# Patient Record
Sex: Female | Born: 1945 | Race: White | Marital: Married | Smoking: Former smoker
Health system: Southern US, Community
[De-identification: ages and names within clinical notes are randomized; demographics above are authoritative.]

## PROBLEM LIST (undated history)

## (undated) DIAGNOSIS — J301 Allergic rhinitis due to pollen: Secondary | ICD-10-CM

## (undated) DIAGNOSIS — N951 Menopausal and female climacteric states: Secondary | ICD-10-CM

## (undated) DIAGNOSIS — N390 Urinary tract infection, site not specified: Secondary | ICD-10-CM

## (undated) DIAGNOSIS — Z79899 Other long term (current) drug therapy: Secondary | ICD-10-CM

## (undated) DIAGNOSIS — Z6822 Body mass index (BMI) 22.0-22.9, adult: Secondary | ICD-10-CM

## (undated) DIAGNOSIS — I671 Cerebral aneurysm, nonruptured: Secondary | ICD-10-CM

## (undated) DIAGNOSIS — M791 Myalgia, unspecified site: Secondary | ICD-10-CM

## (undated) DIAGNOSIS — G4700 Insomnia, unspecified: Secondary | ICD-10-CM

## (undated) DIAGNOSIS — J069 Acute upper respiratory infection, unspecified: Secondary | ICD-10-CM

## (undated) DIAGNOSIS — L309 Dermatitis, unspecified: Secondary | ICD-10-CM

## (undated) DIAGNOSIS — K219 Gastro-esophageal reflux disease without esophagitis: Secondary | ICD-10-CM

## (undated) DIAGNOSIS — R2689 Other abnormalities of gait and mobility: Secondary | ICD-10-CM

## (undated) DIAGNOSIS — R251 Tremor, unspecified: Secondary | ICD-10-CM

## (undated) DIAGNOSIS — I10 Essential (primary) hypertension: Secondary | ICD-10-CM

## (undated) DIAGNOSIS — G729 Myopathy, unspecified: Secondary | ICD-10-CM

## (undated) DIAGNOSIS — R600 Localized edema: Secondary | ICD-10-CM

## (undated) DIAGNOSIS — N3289 Other specified disorders of bladder: Secondary | ICD-10-CM

## (undated) DIAGNOSIS — R059 Cough, unspecified: Secondary | ICD-10-CM

## (undated) DIAGNOSIS — M48061 Spinal stenosis, lumbar region without neurogenic claudication: Secondary | ICD-10-CM

## (undated) DIAGNOSIS — M545 Low back pain, unspecified: Secondary | ICD-10-CM

## (undated) DIAGNOSIS — F419 Anxiety disorder, unspecified: Secondary | ICD-10-CM

## (undated) DIAGNOSIS — E785 Hyperlipidemia, unspecified: Secondary | ICD-10-CM

## (undated) DIAGNOSIS — R0600 Dyspnea, unspecified: Secondary | ICD-10-CM

## (undated) DIAGNOSIS — K2100 Gastro-esophageal reflux disease with esophagitis, without bleeding: Secondary | ICD-10-CM

## (undated) HISTORY — DX: Gastro-esophageal reflux disease without esophagitis: K21.9

## (undated) HISTORY — DX: Acute upper respiratory infection, unspecified: J06.9

## (undated) HISTORY — DX: Menopausal and female climacteric states: N95.1

## (undated) HISTORY — DX: Localized edema: R60.0

## (undated) HISTORY — PX: TUBAL LIGATION: SHX77

## (undated) HISTORY — DX: Myopathy, unspecified: G72.9

## (undated) HISTORY — DX: Anxiety disorder, unspecified: F41.9

## (undated) HISTORY — DX: Allergic rhinitis due to pollen: J30.1

## (undated) HISTORY — DX: Tremor, unspecified: R25.1

## (undated) HISTORY — DX: Body mass index (BMI) 22.0-22.9, adult: Z68.22

## (undated) HISTORY — DX: Insomnia, unspecified: G47.00

## (undated) HISTORY — DX: Spinal stenosis, lumbar region without neurogenic claudication: M48.061

## (undated) HISTORY — DX: Other long term (current) drug therapy: Z79.899

## (undated) HISTORY — DX: Low back pain, unspecified: M54.50

## (undated) HISTORY — DX: Dyspnea, unspecified: R06.00

## (undated) HISTORY — PX: OTHER SURGICAL HISTORY: SHX169

## (undated) HISTORY — DX: Other specified disorders of bladder: N32.89

## (undated) HISTORY — PX: TONSILLECTOMY: SUR1361

## (undated) HISTORY — DX: Gastro-esophageal reflux disease with esophagitis, without bleeding: K21.00

## (undated) HISTORY — DX: Hyperlipidemia, unspecified: E78.5

## (undated) HISTORY — DX: Urinary tract infection, site not specified: N39.0

## (undated) HISTORY — DX: Cerebral aneurysm, nonruptured: I67.1

## (undated) HISTORY — DX: Dermatitis, unspecified: L30.9

## (undated) HISTORY — DX: Cough, unspecified: R05.9

## (undated) HISTORY — DX: Essential (primary) hypertension: I10

## (undated) HISTORY — DX: Myalgia, unspecified site: M79.10

## (undated) HISTORY — DX: Other abnormalities of gait and mobility: R26.89

---

## 2014-11-26 DIAGNOSIS — J019 Acute sinusitis, unspecified: Secondary | ICD-10-CM | POA: Diagnosis not present

## 2014-11-26 DIAGNOSIS — Z6820 Body mass index (BMI) 20.0-20.9, adult: Secondary | ICD-10-CM | POA: Diagnosis not present

## 2015-01-28 DIAGNOSIS — I671 Cerebral aneurysm, nonruptured: Secondary | ICD-10-CM | POA: Diagnosis not present

## 2015-01-28 DIAGNOSIS — F419 Anxiety disorder, unspecified: Secondary | ICD-10-CM | POA: Diagnosis not present

## 2015-01-28 DIAGNOSIS — M545 Low back pain: Secondary | ICD-10-CM | POA: Diagnosis not present

## 2015-01-28 DIAGNOSIS — N3289 Other specified disorders of bladder: Secondary | ICD-10-CM | POA: Diagnosis not present

## 2015-01-28 DIAGNOSIS — Z6821 Body mass index (BMI) 21.0-21.9, adult: Secondary | ICD-10-CM | POA: Diagnosis not present

## 2015-02-04 DIAGNOSIS — S39012D Strain of muscle, fascia and tendon of lower back, subsequent encounter: Secondary | ICD-10-CM | POA: Diagnosis not present

## 2015-02-07 DIAGNOSIS — S39012D Strain of muscle, fascia and tendon of lower back, subsequent encounter: Secondary | ICD-10-CM | POA: Diagnosis not present

## 2015-02-11 DIAGNOSIS — S39012D Strain of muscle, fascia and tendon of lower back, subsequent encounter: Secondary | ICD-10-CM | POA: Diagnosis not present

## 2015-02-14 DIAGNOSIS — S39012D Strain of muscle, fascia and tendon of lower back, subsequent encounter: Secondary | ICD-10-CM | POA: Diagnosis not present

## 2015-02-18 DIAGNOSIS — K210 Gastro-esophageal reflux disease with esophagitis: Secondary | ICD-10-CM | POA: Diagnosis not present

## 2015-02-18 DIAGNOSIS — R32 Unspecified urinary incontinence: Secondary | ICD-10-CM | POA: Diagnosis not present

## 2015-02-18 DIAGNOSIS — M545 Low back pain: Secondary | ICD-10-CM | POA: Diagnosis not present

## 2015-02-18 DIAGNOSIS — M8580 Other specified disorders of bone density and structure, unspecified site: Secondary | ICD-10-CM | POA: Diagnosis not present

## 2015-02-18 DIAGNOSIS — F419 Anxiety disorder, unspecified: Secondary | ICD-10-CM | POA: Diagnosis not present

## 2015-02-18 DIAGNOSIS — J301 Allergic rhinitis due to pollen: Secondary | ICD-10-CM | POA: Diagnosis not present

## 2015-02-18 DIAGNOSIS — E785 Hyperlipidemia, unspecified: Secondary | ICD-10-CM | POA: Diagnosis not present

## 2015-02-18 DIAGNOSIS — Z79899 Other long term (current) drug therapy: Secondary | ICD-10-CM | POA: Diagnosis not present

## 2015-02-28 DIAGNOSIS — M5416 Radiculopathy, lumbar region: Secondary | ICD-10-CM | POA: Diagnosis not present

## 2015-03-04 ENCOUNTER — Other Ambulatory Visit: Payer: Self-pay | Admitting: Physical Medicine and Rehabilitation

## 2015-03-04 DIAGNOSIS — M47816 Spondylosis without myelopathy or radiculopathy, lumbar region: Secondary | ICD-10-CM

## 2015-03-11 ENCOUNTER — Ambulatory Visit
Admission: RE | Admit: 2015-03-11 | Discharge: 2015-03-11 | Disposition: A | Discharge status: Home / Self Care | Payer: Medicare Other | Source: Ambulatory Visit | Attending: Physical Medicine and Rehabilitation | Admitting: Physical Medicine and Rehabilitation

## 2015-03-11 DIAGNOSIS — M4316 Spondylolisthesis, lumbar region: Secondary | ICD-10-CM | POA: Diagnosis not present

## 2015-03-11 DIAGNOSIS — M47816 Spondylosis without myelopathy or radiculopathy, lumbar region: Secondary | ICD-10-CM

## 2015-03-11 DIAGNOSIS — M9974 Connective tissue and disc stenosis of intervertebral foramina of sacral region: Secondary | ICD-10-CM | POA: Diagnosis not present

## 2015-03-11 DIAGNOSIS — M5126 Other intervertebral disc displacement, lumbar region: Secondary | ICD-10-CM | POA: Diagnosis not present

## 2015-03-11 DIAGNOSIS — M9973 Connective tissue and disc stenosis of intervertebral foramina of lumbar region: Secondary | ICD-10-CM | POA: Diagnosis not present

## 2015-03-11 MED ORDER — IOHEXOL 180 MG/ML  SOLN
15.0000 mL | Freq: Once | INTRAMUSCULAR | Status: AC | PRN
  Administered 2015-03-11: 15 mL via INTRATHECAL

## 2015-03-11 MED ORDER — DIAZEPAM 5 MG PO TABS
5.0000 mg | ORAL_TABLET | Freq: Once | ORAL | Status: AC
  Administered 2015-03-11: 5 mg via ORAL

## 2015-03-11 NOTE — Progress Notes (Signed)
Pt states she has been off Tramadol for the past 2 days.  Discharge instructions explained to pt,.

## 2015-03-11 NOTE — Discharge Instructions (Signed)
Myelogram Discharge Instructions  1. Go home and rest quietly for the next 24 hours.  It is important to lie flat for the next 24 hours.  Get up only to go to the restroom.  You may lie in the bed or on a couch on your back, your stomach, your left side or your right side.  You may have one pillow under your head.  You may have pillows between your knees while you are on your side or under your knees while you are on your back.  2. DO NOT drive today.  Recline the seat as far back as it will go, while still wearing your seat belt, on the way home.  3. You may get up to go to the bathroom as needed.  You may sit up for 10 minutes to eat.  You may resume your normal diet and medications unless otherwise indicated.  Drink lots of extra fluids today and tomorrow.  4. The incidence of headache, nausea, or vomiting is about 5% (one in 20 patients).  If you develop a headache, lie flat and drink plenty of fluids until the headache goes away.  Caffeinated beverages may be helpful.  If you develop severe nausea and vomiting or a headache that does not go away with flat bed rest, call [phone].  5. You may resume normal activities after your 24 hours of bed rest is over; however, do not exert yourself strongly or do any heavy lifting tomorrow. If when you get up you have a headache when standing, go back to bed and force fluids for another 24 hours.  6. Call your physician for a follow-up appointment.  The results of your myelogram will be sent directly to your physician by the following day.  7. If you have any questions or if complications develop after you arrive home, please call [phone].  Discharge instructions have been explained to the patient.  The patient, or the person responsible for the patient, fully understands these instructions.       May resume Tramadol on Mar 12, 2015, after 9:30 am.

## 2015-03-15 DIAGNOSIS — M545 Low back pain: Secondary | ICD-10-CM | POA: Diagnosis not present

## 2015-03-15 DIAGNOSIS — E785 Hyperlipidemia, unspecified: Secondary | ICD-10-CM | POA: Diagnosis not present

## 2015-03-15 DIAGNOSIS — I677 Cerebral arteritis, not elsewhere classified: Secondary | ICD-10-CM | POA: Diagnosis not present

## 2015-03-15 DIAGNOSIS — J189 Pneumonia, unspecified organism: Secondary | ICD-10-CM | POA: Diagnosis not present

## 2015-03-15 DIAGNOSIS — I10 Essential (primary) hypertension: Secondary | ICD-10-CM | POA: Diagnosis not present

## 2015-03-28 DIAGNOSIS — M5136 Other intervertebral disc degeneration, lumbar region: Secondary | ICD-10-CM | POA: Diagnosis not present

## 2015-03-28 DIAGNOSIS — M47816 Spondylosis without myelopathy or radiculopathy, lumbar region: Secondary | ICD-10-CM | POA: Diagnosis not present

## 2015-05-01 DIAGNOSIS — M5416 Radiculopathy, lumbar region: Secondary | ICD-10-CM | POA: Diagnosis not present

## 2015-05-15 DIAGNOSIS — M5136 Other intervertebral disc degeneration, lumbar region: Secondary | ICD-10-CM | POA: Diagnosis not present

## 2015-05-15 DIAGNOSIS — M5416 Radiculopathy, lumbar region: Secondary | ICD-10-CM | POA: Diagnosis not present

## 2015-06-12 DIAGNOSIS — M47816 Spondylosis without myelopathy or radiculopathy, lumbar region: Secondary | ICD-10-CM | POA: Diagnosis not present

## 2015-06-12 DIAGNOSIS — M5136 Other intervertebral disc degeneration, lumbar region: Secondary | ICD-10-CM | POA: Diagnosis not present

## 2015-06-12 DIAGNOSIS — M5416 Radiculopathy, lumbar region: Secondary | ICD-10-CM | POA: Diagnosis not present

## 2015-06-28 DIAGNOSIS — L309 Dermatitis, unspecified: Secondary | ICD-10-CM | POA: Diagnosis not present

## 2015-06-28 DIAGNOSIS — E785 Hyperlipidemia, unspecified: Secondary | ICD-10-CM | POA: Diagnosis not present

## 2015-06-28 DIAGNOSIS — M8580 Other specified disorders of bone density and structure, unspecified site: Secondary | ICD-10-CM | POA: Diagnosis not present

## 2015-06-28 DIAGNOSIS — M545 Low back pain: Secondary | ICD-10-CM | POA: Diagnosis not present

## 2015-06-28 DIAGNOSIS — Z79899 Other long term (current) drug therapy: Secondary | ICD-10-CM | POA: Diagnosis not present

## 2015-07-18 DIAGNOSIS — Z803 Family history of malignant neoplasm of breast: Secondary | ICD-10-CM | POA: Diagnosis not present

## 2015-07-18 DIAGNOSIS — Z1231 Encounter for screening mammogram for malignant neoplasm of breast: Secondary | ICD-10-CM | POA: Diagnosis not present

## 2015-08-14 DIAGNOSIS — Z23 Encounter for immunization: Secondary | ICD-10-CM | POA: Diagnosis not present

## 2015-09-20 DIAGNOSIS — J189 Pneumonia, unspecified organism: Secondary | ICD-10-CM | POA: Diagnosis not present

## 2015-09-20 DIAGNOSIS — J9801 Acute bronchospasm: Secondary | ICD-10-CM | POA: Diagnosis not present

## 2015-09-20 DIAGNOSIS — J301 Allergic rhinitis due to pollen: Secondary | ICD-10-CM | POA: Diagnosis not present

## 2015-09-20 DIAGNOSIS — J029 Acute pharyngitis, unspecified: Secondary | ICD-10-CM | POA: Diagnosis not present

## 2015-11-14 DIAGNOSIS — H2513 Age-related nuclear cataract, bilateral: Secondary | ICD-10-CM | POA: Diagnosis not present

## 2015-12-06 DIAGNOSIS — R05 Cough: Secondary | ICD-10-CM | POA: Diagnosis not present

## 2015-12-06 DIAGNOSIS — L219 Seborrheic dermatitis, unspecified: Secondary | ICD-10-CM | POA: Diagnosis not present

## 2015-12-06 DIAGNOSIS — J301 Allergic rhinitis due to pollen: Secondary | ICD-10-CM | POA: Diagnosis not present

## 2015-12-06 DIAGNOSIS — J189 Pneumonia, unspecified organism: Secondary | ICD-10-CM | POA: Diagnosis not present

## 2015-12-06 DIAGNOSIS — G4700 Insomnia, unspecified: Secondary | ICD-10-CM | POA: Diagnosis not present

## 2016-01-13 DIAGNOSIS — I10 Essential (primary) hypertension: Secondary | ICD-10-CM | POA: Diagnosis not present

## 2016-01-13 DIAGNOSIS — E785 Hyperlipidemia, unspecified: Secondary | ICD-10-CM | POA: Diagnosis not present

## 2016-01-13 DIAGNOSIS — M818 Other osteoporosis without current pathological fracture: Secondary | ICD-10-CM | POA: Diagnosis not present

## 2016-01-13 DIAGNOSIS — Z79899 Other long term (current) drug therapy: Secondary | ICD-10-CM | POA: Diagnosis not present

## 2016-01-13 DIAGNOSIS — I671 Cerebral aneurysm, nonruptured: Secondary | ICD-10-CM | POA: Diagnosis not present

## 2016-01-13 DIAGNOSIS — G4700 Insomnia, unspecified: Secondary | ICD-10-CM | POA: Diagnosis not present

## 2016-01-28 IMAGING — RF DG MYELOGRAPHY LUMBAR INJ LUMBOSACRAL
12 of 14 series · 12 of 14 positions shown · non-contrast
Comparison: None

CLINICAL DATA: Low back pain extending into the lower extremities
bilaterally. Pain extends into the right buttock and lateral aspect
of the right lower extremity. Pain extends into both the dorsal and
plantar surfaces of the right foot. The patient also has pain along
the plantar surface of the left foot.
TECHNIQUE: Contiguous axial images were obtained through the Lumbar spine after
the intrathecal infusion of infusion. Coronal and sagittal
reconstructions were obtained of the axial image sets.

[Series 2: (hospital) · 1 of 1 slices shown (1 of 2)]
[im 1/1]
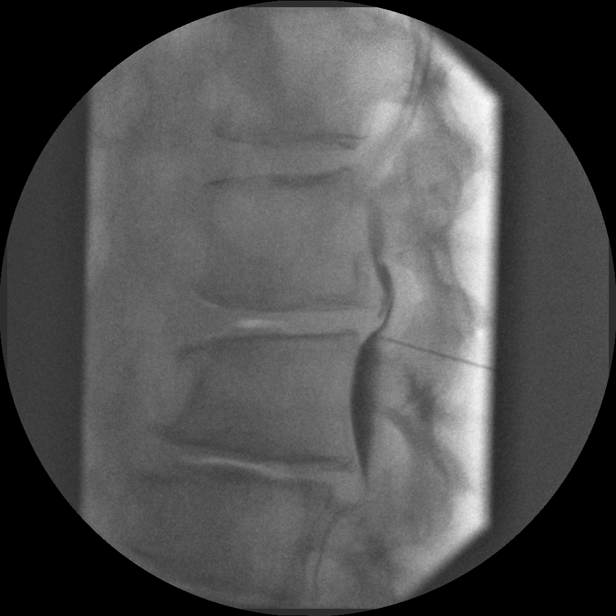

[Series 4: (hospital) · 1 of 1 slices shown (2 of 2)]
[im 1/1]
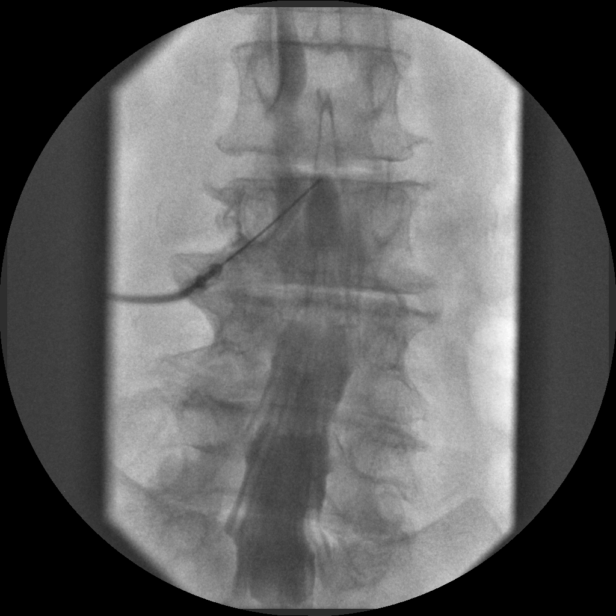

[Series 5: myelogram  white · 1 of 1 slices shown (1 of 10)]
[im 1/1]
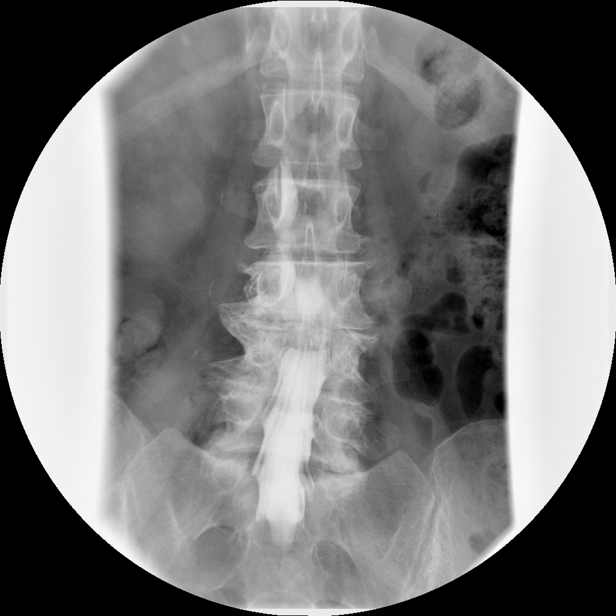

[Series 7: myelogram  white · 1 of 1 slices shown (2 of 10)]
[im 1/1]
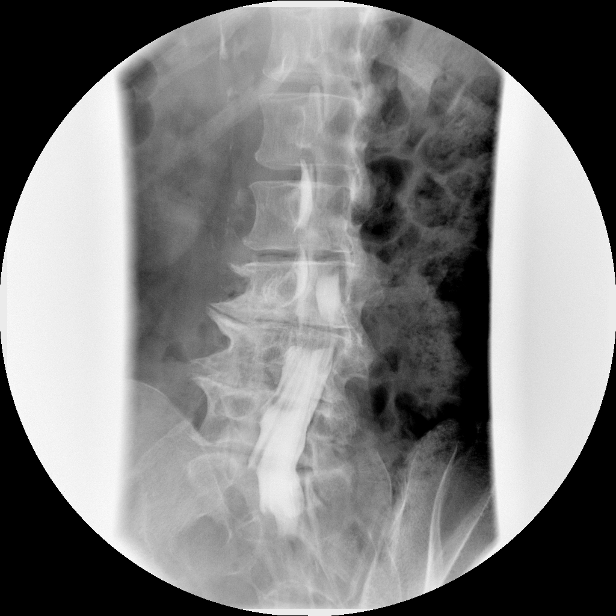

[Series 8: myelogram  white · 1 of 1 slices shown (3 of 10)]
[im 1/1]
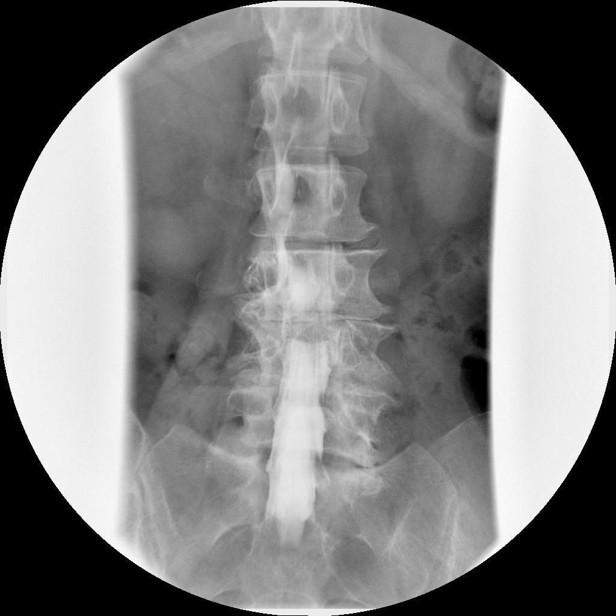

[Series 9: myelogram  white · 1 of 1 slices shown (4 of 10)]
[im 1/1]
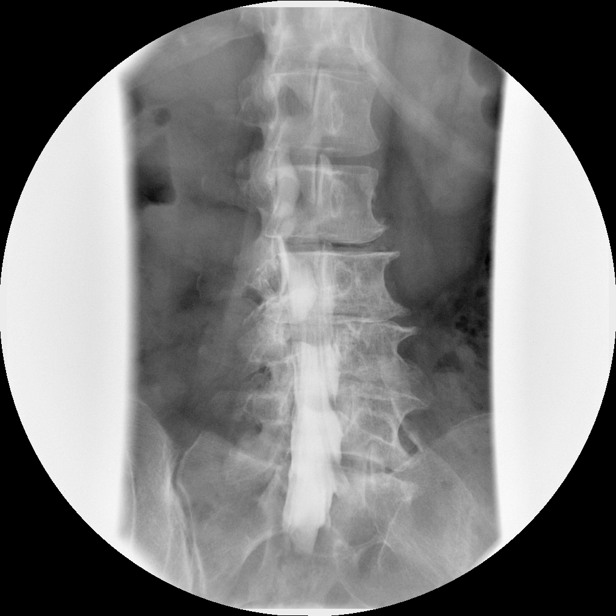

[Series 10: myelogram  white · 1 of 1 slices shown (5 of 10)]
[im 1/1]
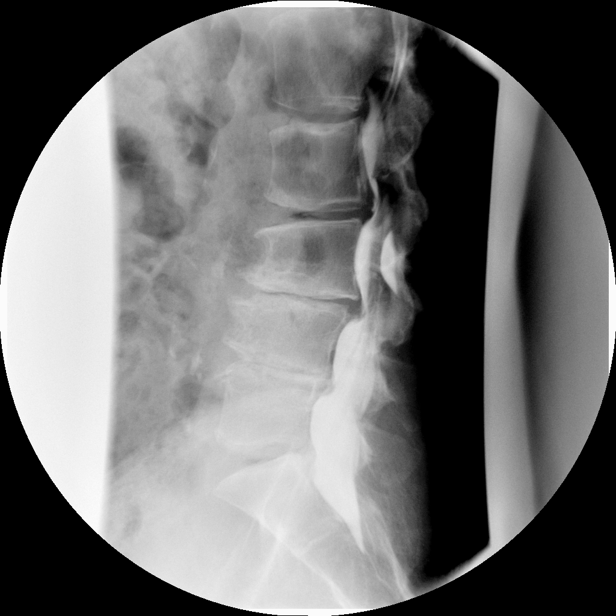

[Series 11: myelogram  white · 1 of 1 slices shown (6 of 10)]
[im 1/1]
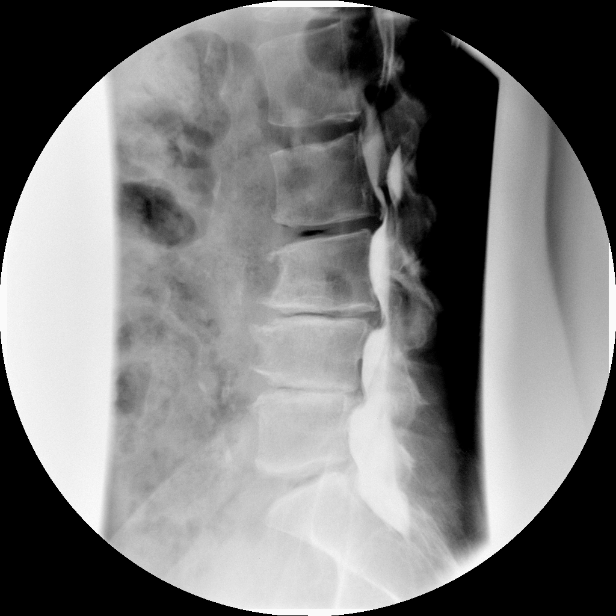

[Series 12: myelogram  white · 1 of 1 slices shown (7 of 10)]
[im 1/1]
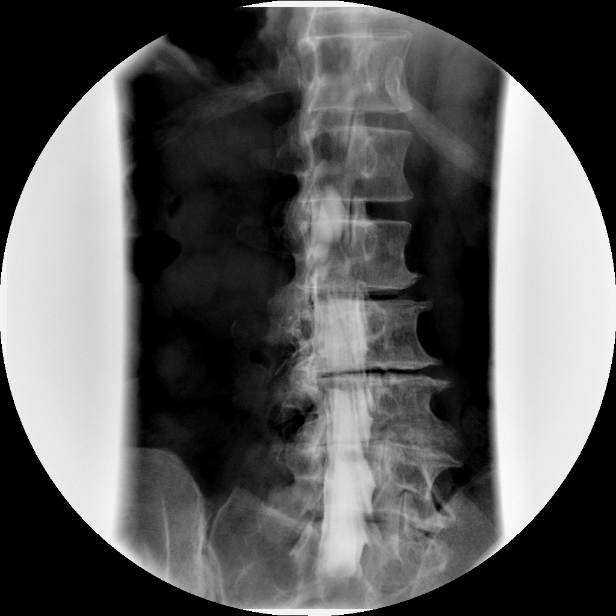

[Series 14: myelogram  white · 1 of 1 slices shown (8 of 10)]
[im 1/1]
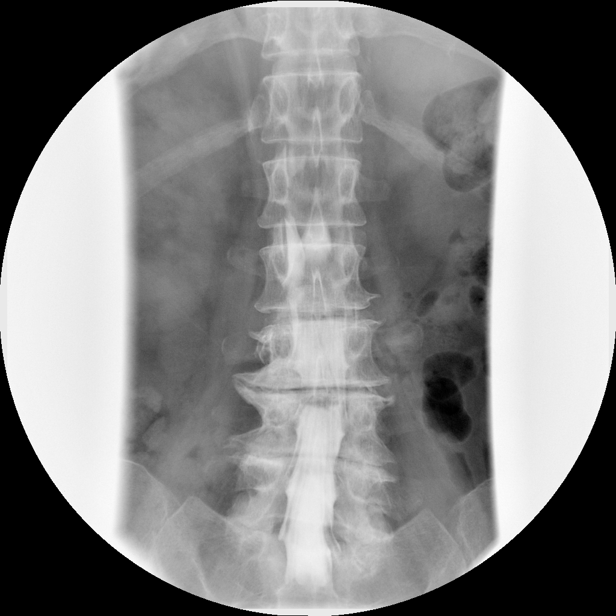

[Series 15: myelogram  white · 1 of 1 slices shown (9 of 10)]
[im 1/1]
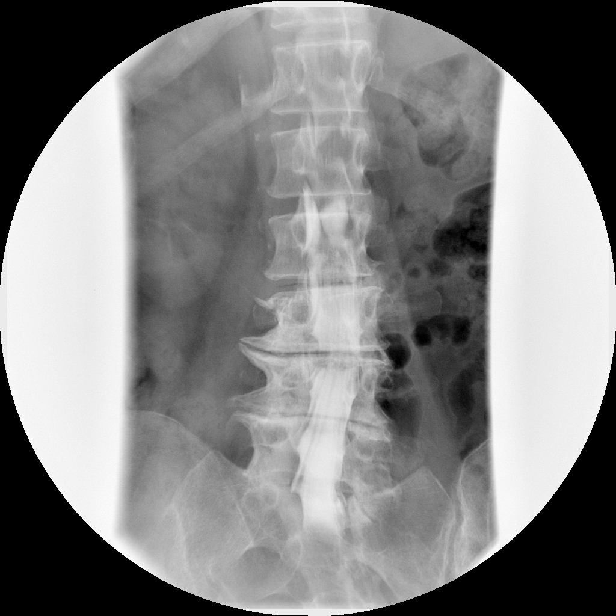

[Series 16: myelogram  white · 1 of 1 slices shown (10 of 10)]
[im 1/1]
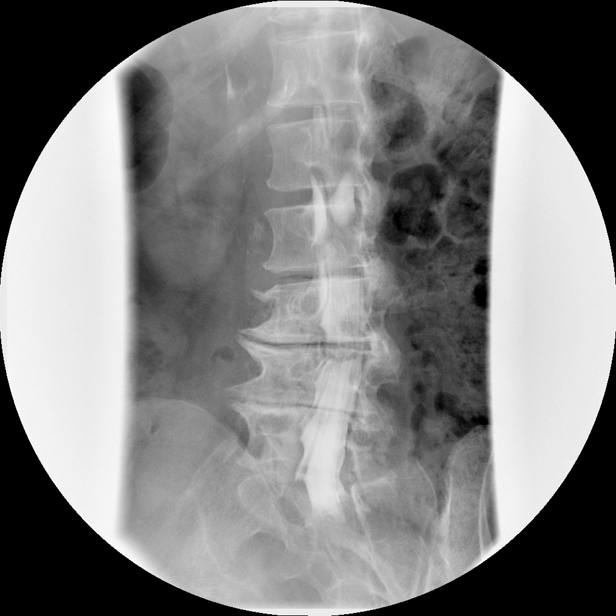

[12 of 14 positions shown; findings below may reference images not displayed]

EXAM:
LUMBAR MYELOGRAM

FLUOROSCOPY TIME:  Fluoroscopy Time:  1 minutes 0 seconds

Number of Acquired Images:  16

PROCEDURE:
After thorough discussion of risks and benefits of the procedure
including bleeding, infection, injury to nerves, blood vessels,
adjacent structures as well as headache and CSF leak, written and
oral informed consent was obtained. Consent was obtained by Dr.
Magalys Danko. Time out form was completed.

Patient was positioned prone on the fluoroscopy table. Local
anesthesia was provided with 1% lidocaine without epinephrine after
prepped and draped in the usual sterile fashion. Puncture was
performed at L2-3 using a 3 1/2 inch 22-gauge spinal needle via left
paramedian approach. Using a single pass through the dura, the
needle was placed within the thecal sac, with return of clear CSF.
15 mL of Lmnipaque-8FA was injected into the thecal sac, with normal
opacification of the nerve roots and cauda equina consistent with
free flow within the subarachnoid space.

I personally performed the lumbar puncture and administered the
intrathecal contrast. I also personally supervised acquisition of
the myelogram images.
FINDINGS: LUMBAR MYELOGRAM FINDINGS:

Dextro convex curvature of the lumbar spine is centered at L3-4 with
asymmetric left-sided endplate spurring. A broad-based disc
protrusion is present at L3-4 with subarticular narrowing
bilaterally, left greater than right. Right greater than left
subarticular narrowing is present at L4-5. There is early truncation
of the L4 nerve root.

Grade 1 retrolisthesis is present at L3-4 and L4-5 with a
broad-based disc protrusion of both levels. Disc protrusions are
noted at L1-2 and L2-3 is well. Subarticular narrowing is evident
bilaterally at L2-3.

The disc protrusion at L3-4 is exaggerated with standing. There is
no abnormal motion with flexion or extension.

CT LUMBAR MYELOGRAM FINDINGS:

The lumbar spine is imaged from the midbody of T12 through the
midbody of S3. Rightward curvature lumbar spine is centered at L3-4.

Atherosclerotic calcifications are present in the abdominal aorta
and branch vessels without aneurysm. There is no significant
adenopathy. The visualized solid organs are within normal limits.

T12-L1:  Negative.

L1-2:  Slight disc bulging is present without significant stenosis.

L2-3: A prominent central disc protrusion and calcified disc is
present. This results in mild subarticular narrowing bilaterally,
left greater than right. The foramina are patent.

L3-4: A broad-based disc protrusion is present. Mild facet
hypertrophy is noted. Moderate left and mild right subarticular
narrowing is evident. Moderate left and mild right foraminal
stenosis is present.

L4-5: A broad-based disc protrusion is present. Subarticular
narrowing is present bilaterally. Moderate foraminal stenosis is
worse right than left.

L5-S1: A rightward disc protrusion is present. The central canal is
patent. Mild left and severe right foraminal stenosis is evident.
IMPRESSION: 1. Slight disc bulging at L1-2 without significant stenosis.
2. Mild subarticular narrowing bilaterally at L2-3 secondary to a
prominent central disc protrusion and calcified disc. The foramina
are patent.
3. Moderate left and mild right subarticular and foraminal stenosis
at L3-4 secondary to a broad-based disc protrusion and facet
hypertrophy.
4. Grade 1 retrolisthesis at L3-4 and L4-5.
5. Moderate foraminal stenosis is present bilaterally at L4-5, worse
on the right.
6. Moderate right and mild left subarticular narrowing at L4-5.
7. Mild left and severe right foraminal stenosis at L5-S1.

## 2016-01-29 DIAGNOSIS — Z6821 Body mass index (BMI) 21.0-21.9, adult: Secondary | ICD-10-CM | POA: Diagnosis not present

## 2016-01-29 DIAGNOSIS — N390 Urinary tract infection, site not specified: Secondary | ICD-10-CM | POA: Diagnosis not present

## 2016-01-29 DIAGNOSIS — I10 Essential (primary) hypertension: Secondary | ICD-10-CM | POA: Diagnosis not present

## 2016-01-29 DIAGNOSIS — R300 Dysuria: Secondary | ICD-10-CM | POA: Diagnosis not present

## 2016-04-14 DIAGNOSIS — L820 Inflamed seborrheic keratosis: Secondary | ICD-10-CM | POA: Diagnosis not present

## 2016-04-14 DIAGNOSIS — L821 Other seborrheic keratosis: Secondary | ICD-10-CM | POA: Diagnosis not present

## 2016-05-22 DIAGNOSIS — J189 Pneumonia, unspecified organism: Secondary | ICD-10-CM | POA: Diagnosis not present

## 2016-05-22 DIAGNOSIS — J9801 Acute bronchospasm: Secondary | ICD-10-CM | POA: Diagnosis not present

## 2016-07-23 DIAGNOSIS — Z1231 Encounter for screening mammogram for malignant neoplasm of breast: Secondary | ICD-10-CM | POA: Diagnosis not present

## 2016-07-23 DIAGNOSIS — Z6821 Body mass index (BMI) 21.0-21.9, adult: Secondary | ICD-10-CM | POA: Diagnosis not present

## 2016-07-23 DIAGNOSIS — J019 Acute sinusitis, unspecified: Secondary | ICD-10-CM | POA: Diagnosis not present

## 2016-07-23 DIAGNOSIS — J301 Allergic rhinitis due to pollen: Secondary | ICD-10-CM | POA: Diagnosis not present

## 2016-08-05 DIAGNOSIS — Z23 Encounter for immunization: Secondary | ICD-10-CM | POA: Diagnosis not present

## 2016-08-05 DIAGNOSIS — I10 Essential (primary) hypertension: Secondary | ICD-10-CM | POA: Diagnosis not present

## 2016-08-05 DIAGNOSIS — Z9181 History of falling: Secondary | ICD-10-CM | POA: Diagnosis not present

## 2016-08-05 DIAGNOSIS — Z6821 Body mass index (BMI) 21.0-21.9, adult: Secondary | ICD-10-CM | POA: Diagnosis not present

## 2016-08-05 DIAGNOSIS — E785 Hyperlipidemia, unspecified: Secondary | ICD-10-CM | POA: Diagnosis not present

## 2016-08-05 DIAGNOSIS — Z79899 Other long term (current) drug therapy: Secondary | ICD-10-CM | POA: Diagnosis not present

## 2016-08-05 DIAGNOSIS — M818 Other osteoporosis without current pathological fracture: Secondary | ICD-10-CM | POA: Diagnosis not present

## 2016-08-05 DIAGNOSIS — J301 Allergic rhinitis due to pollen: Secondary | ICD-10-CM | POA: Diagnosis not present

## 2016-08-07 DIAGNOSIS — Z1231 Encounter for screening mammogram for malignant neoplasm of breast: Secondary | ICD-10-CM | POA: Diagnosis not present

## 2016-12-02 DIAGNOSIS — E785 Hyperlipidemia, unspecified: Secondary | ICD-10-CM | POA: Diagnosis not present

## 2016-12-02 DIAGNOSIS — Z0000 Encounter for general adult medical examination without abnormal findings: Secondary | ICD-10-CM | POA: Diagnosis not present

## 2016-12-02 DIAGNOSIS — M8589 Other specified disorders of bone density and structure, multiple sites: Secondary | ICD-10-CM | POA: Diagnosis not present

## 2016-12-02 DIAGNOSIS — D72829 Elevated white blood cell count, unspecified: Secondary | ICD-10-CM | POA: Diagnosis not present

## 2016-12-02 DIAGNOSIS — I10 Essential (primary) hypertension: Secondary | ICD-10-CM | POA: Diagnosis not present

## 2016-12-21 DIAGNOSIS — M85851 Other specified disorders of bone density and structure, right thigh: Secondary | ICD-10-CM | POA: Diagnosis not present

## 2016-12-21 DIAGNOSIS — M8589 Other specified disorders of bone density and structure, multiple sites: Secondary | ICD-10-CM | POA: Diagnosis not present

## 2017-02-22 DIAGNOSIS — M25551 Pain in right hip: Secondary | ICD-10-CM | POA: Diagnosis not present

## 2017-02-26 ENCOUNTER — Other Ambulatory Visit: Payer: Self-pay | Admitting: Orthopedic Surgery

## 2017-02-26 DIAGNOSIS — M545 Low back pain: Principal | ICD-10-CM

## 2017-02-26 DIAGNOSIS — G8929 Other chronic pain: Secondary | ICD-10-CM

## 2017-03-08 DIAGNOSIS — I10 Essential (primary) hypertension: Secondary | ICD-10-CM | POA: Diagnosis not present

## 2017-03-08 DIAGNOSIS — Z139 Encounter for screening, unspecified: Secondary | ICD-10-CM | POA: Diagnosis not present

## 2017-03-08 DIAGNOSIS — E785 Hyperlipidemia, unspecified: Secondary | ICD-10-CM | POA: Diagnosis not present

## 2017-03-08 DIAGNOSIS — M8589 Other specified disorders of bone density and structure, multiple sites: Secondary | ICD-10-CM | POA: Diagnosis not present

## 2017-03-08 DIAGNOSIS — Z6821 Body mass index (BMI) 21.0-21.9, adult: Secondary | ICD-10-CM | POA: Diagnosis not present

## 2017-03-10 ENCOUNTER — Ambulatory Visit
Admission: RE | Admit: 2017-03-10 | Discharge: 2017-03-10 | Disposition: A | Discharge status: Home / Self Care | Payer: Medicare Other | Source: Ambulatory Visit | Attending: Orthopedic Surgery | Admitting: Orthopedic Surgery

## 2017-03-10 DIAGNOSIS — G8929 Other chronic pain: Secondary | ICD-10-CM

## 2017-03-10 DIAGNOSIS — M4807 Spinal stenosis, lumbosacral region: Secondary | ICD-10-CM | POA: Diagnosis not present

## 2017-03-10 DIAGNOSIS — M545 Low back pain: Principal | ICD-10-CM

## 2017-03-10 MED ORDER — ONDANSETRON HCL 4 MG/2ML IJ SOLN: 4.0000 mg | Freq: Four times a day (QID) | INTRAMUSCULAR | Status: DC | PRN

## 2017-03-10 MED ORDER — IOPAMIDOL (ISOVUE-M 200) INJECTION 41%
15.0000 mL | Freq: Once | INTRAMUSCULAR | Status: AC
  Administered 2017-03-10: 15 mL via INTRATHECAL

## 2017-03-10 MED ORDER — DIAZEPAM 5 MG PO TABS
5.0000 mg | ORAL_TABLET | Freq: Once | ORAL | Status: AC
  Administered 2017-03-10: 5 mg via ORAL

## 2017-03-10 NOTE — Discharge Instructions (Signed)

## 2017-03-23 DIAGNOSIS — L219 Seborrheic dermatitis, unspecified: Secondary | ICD-10-CM | POA: Diagnosis not present

## 2017-03-24 DIAGNOSIS — Z6821 Body mass index (BMI) 21.0-21.9, adult: Secondary | ICD-10-CM | POA: Diagnosis not present

## 2017-03-24 DIAGNOSIS — J069 Acute upper respiratory infection, unspecified: Secondary | ICD-10-CM | POA: Diagnosis not present

## 2017-04-08 DIAGNOSIS — M5416 Radiculopathy, lumbar region: Secondary | ICD-10-CM | POA: Diagnosis not present

## 2017-04-08 DIAGNOSIS — G8929 Other chronic pain: Secondary | ICD-10-CM | POA: Diagnosis not present

## 2017-04-08 DIAGNOSIS — M5136 Other intervertebral disc degeneration, lumbar region: Secondary | ICD-10-CM | POA: Diagnosis not present

## 2017-05-11 DIAGNOSIS — Z6821 Body mass index (BMI) 21.0-21.9, adult: Secondary | ICD-10-CM | POA: Diagnosis not present

## 2017-05-11 DIAGNOSIS — S70269A Insect bite (nonvenomous), unspecified hip, initial encounter: Secondary | ICD-10-CM | POA: Diagnosis not present

## 2017-05-24 DIAGNOSIS — L219 Seborrheic dermatitis, unspecified: Secondary | ICD-10-CM | POA: Diagnosis not present

## 2017-07-14 DIAGNOSIS — J019 Acute sinusitis, unspecified: Secondary | ICD-10-CM | POA: Diagnosis not present

## 2017-07-14 DIAGNOSIS — Z6821 Body mass index (BMI) 21.0-21.9, adult: Secondary | ICD-10-CM | POA: Diagnosis not present

## 2017-08-13 DIAGNOSIS — Z23 Encounter for immunization: Secondary | ICD-10-CM | POA: Diagnosis not present

## 2017-08-31 DIAGNOSIS — J029 Acute pharyngitis, unspecified: Secondary | ICD-10-CM | POA: Diagnosis not present

## 2017-08-31 DIAGNOSIS — Z6821 Body mass index (BMI) 21.0-21.9, adult: Secondary | ICD-10-CM | POA: Diagnosis not present

## 2017-08-31 DIAGNOSIS — J069 Acute upper respiratory infection, unspecified: Secondary | ICD-10-CM | POA: Diagnosis not present

## 2017-09-14 DIAGNOSIS — Z6822 Body mass index (BMI) 22.0-22.9, adult: Secondary | ICD-10-CM | POA: Diagnosis not present

## 2017-09-14 DIAGNOSIS — M8589 Other specified disorders of bone density and structure, multiple sites: Secondary | ICD-10-CM | POA: Diagnosis not present

## 2017-09-14 DIAGNOSIS — Z9181 History of falling: Secondary | ICD-10-CM | POA: Diagnosis not present

## 2017-09-14 DIAGNOSIS — E785 Hyperlipidemia, unspecified: Secondary | ICD-10-CM | POA: Diagnosis not present

## 2017-09-14 DIAGNOSIS — Z1331 Encounter for screening for depression: Secondary | ICD-10-CM | POA: Diagnosis not present

## 2017-09-14 DIAGNOSIS — I10 Essential (primary) hypertension: Secondary | ICD-10-CM | POA: Diagnosis not present

## 2017-09-29 DIAGNOSIS — Z1231 Encounter for screening mammogram for malignant neoplasm of breast: Secondary | ICD-10-CM | POA: Diagnosis not present

## 2017-11-16 DIAGNOSIS — J019 Acute sinusitis, unspecified: Secondary | ICD-10-CM | POA: Diagnosis not present

## 2017-12-09 DIAGNOSIS — Z136 Encounter for screening for cardiovascular disorders: Secondary | ICD-10-CM | POA: Diagnosis not present

## 2017-12-09 DIAGNOSIS — Z9181 History of falling: Secondary | ICD-10-CM | POA: Diagnosis not present

## 2017-12-09 DIAGNOSIS — Z0000 Encounter for general adult medical examination without abnormal findings: Secondary | ICD-10-CM | POA: Diagnosis not present

## 2017-12-09 DIAGNOSIS — Z1211 Encounter for screening for malignant neoplasm of colon: Secondary | ICD-10-CM | POA: Diagnosis not present

## 2017-12-09 DIAGNOSIS — Z1331 Encounter for screening for depression: Secondary | ICD-10-CM | POA: Diagnosis not present

## 2017-12-09 DIAGNOSIS — E785 Hyperlipidemia, unspecified: Secondary | ICD-10-CM | POA: Diagnosis not present

## 2017-12-09 DIAGNOSIS — Z1231 Encounter for screening mammogram for malignant neoplasm of breast: Secondary | ICD-10-CM | POA: Diagnosis not present

## 2018-01-18 DIAGNOSIS — Z6822 Body mass index (BMI) 22.0-22.9, adult: Secondary | ICD-10-CM | POA: Diagnosis not present

## 2018-01-18 DIAGNOSIS — J302 Other seasonal allergic rhinitis: Secondary | ICD-10-CM | POA: Diagnosis not present

## 2018-01-18 DIAGNOSIS — S00411A Abrasion of right ear, initial encounter: Secondary | ICD-10-CM | POA: Diagnosis not present

## 2018-03-16 DIAGNOSIS — E785 Hyperlipidemia, unspecified: Secondary | ICD-10-CM | POA: Diagnosis not present

## 2018-03-16 DIAGNOSIS — I10 Essential (primary) hypertension: Secondary | ICD-10-CM | POA: Diagnosis not present

## 2018-03-16 DIAGNOSIS — M8589 Other specified disorders of bone density and structure, multiple sites: Secondary | ICD-10-CM | POA: Diagnosis not present

## 2018-03-16 DIAGNOSIS — Z139 Encounter for screening, unspecified: Secondary | ICD-10-CM | POA: Diagnosis not present

## 2018-03-16 DIAGNOSIS — K219 Gastro-esophageal reflux disease without esophagitis: Secondary | ICD-10-CM | POA: Diagnosis not present

## 2018-06-09 DIAGNOSIS — H52222 Regular astigmatism, left eye: Secondary | ICD-10-CM | POA: Diagnosis not present

## 2018-06-09 DIAGNOSIS — H2513 Age-related nuclear cataract, bilateral: Secondary | ICD-10-CM | POA: Diagnosis not present

## 2018-06-24 DIAGNOSIS — L219 Seborrheic dermatitis, unspecified: Secondary | ICD-10-CM | POA: Diagnosis not present

## 2018-06-24 DIAGNOSIS — L719 Rosacea, unspecified: Secondary | ICD-10-CM | POA: Diagnosis not present

## 2018-07-18 DIAGNOSIS — J208 Acute bronchitis due to other specified organisms: Secondary | ICD-10-CM | POA: Diagnosis not present

## 2018-07-18 DIAGNOSIS — I10 Essential (primary) hypertension: Secondary | ICD-10-CM | POA: Diagnosis not present

## 2018-08-12 DIAGNOSIS — Z23 Encounter for immunization: Secondary | ICD-10-CM | POA: Diagnosis not present

## 2018-09-20 DIAGNOSIS — E785 Hyperlipidemia, unspecified: Secondary | ICD-10-CM | POA: Diagnosis not present

## 2018-09-20 DIAGNOSIS — M8589 Other specified disorders of bone density and structure, multiple sites: Secondary | ICD-10-CM | POA: Diagnosis not present

## 2018-09-20 DIAGNOSIS — K219 Gastro-esophageal reflux disease without esophagitis: Secondary | ICD-10-CM | POA: Diagnosis not present

## 2018-09-20 DIAGNOSIS — I10 Essential (primary) hypertension: Secondary | ICD-10-CM | POA: Diagnosis not present

## 2018-09-23 DIAGNOSIS — J208 Acute bronchitis due to other specified organisms: Secondary | ICD-10-CM | POA: Diagnosis not present

## 2018-10-14 DIAGNOSIS — J208 Acute bronchitis due to other specified organisms: Secondary | ICD-10-CM | POA: Diagnosis not present

## 2018-10-15 DIAGNOSIS — Z1231 Encounter for screening mammogram for malignant neoplasm of breast: Secondary | ICD-10-CM | POA: Diagnosis not present

## 2018-12-07 DIAGNOSIS — K580 Irritable bowel syndrome with diarrhea: Secondary | ICD-10-CM | POA: Diagnosis not present

## 2018-12-07 DIAGNOSIS — I10 Essential (primary) hypertension: Secondary | ICD-10-CM | POA: Diagnosis not present

## 2018-12-07 DIAGNOSIS — Z6821 Body mass index (BMI) 21.0-21.9, adult: Secondary | ICD-10-CM | POA: Diagnosis not present

## 2018-12-07 DIAGNOSIS — Z1211 Encounter for screening for malignant neoplasm of colon: Secondary | ICD-10-CM | POA: Diagnosis not present

## 2018-12-26 DIAGNOSIS — Z1211 Encounter for screening for malignant neoplasm of colon: Secondary | ICD-10-CM | POA: Diagnosis not present

## 2018-12-26 DIAGNOSIS — Z1212 Encounter for screening for malignant neoplasm of rectum: Secondary | ICD-10-CM | POA: Diagnosis not present

## 2019-03-23 DIAGNOSIS — I10 Essential (primary) hypertension: Secondary | ICD-10-CM | POA: Diagnosis not present

## 2019-03-23 DIAGNOSIS — K219 Gastro-esophageal reflux disease without esophagitis: Secondary | ICD-10-CM | POA: Diagnosis not present

## 2019-03-23 DIAGNOSIS — E785 Hyperlipidemia, unspecified: Secondary | ICD-10-CM | POA: Diagnosis not present

## 2019-03-23 DIAGNOSIS — Z9181 History of falling: Secondary | ICD-10-CM | POA: Diagnosis not present

## 2019-03-23 DIAGNOSIS — Z139 Encounter for screening, unspecified: Secondary | ICD-10-CM | POA: Diagnosis not present

## 2019-03-23 DIAGNOSIS — M8589 Other specified disorders of bone density and structure, multiple sites: Secondary | ICD-10-CM | POA: Diagnosis not present

## 2019-05-11 DIAGNOSIS — I10 Essential (primary) hypertension: Secondary | ICD-10-CM | POA: Diagnosis not present

## 2019-08-10 DIAGNOSIS — R269 Unspecified abnormalities of gait and mobility: Secondary | ICD-10-CM | POA: Diagnosis not present

## 2019-08-10 DIAGNOSIS — R262 Difficulty in walking, not elsewhere classified: Secondary | ICD-10-CM | POA: Diagnosis not present

## 2019-08-10 DIAGNOSIS — Z23 Encounter for immunization: Secondary | ICD-10-CM | POA: Diagnosis not present

## 2019-08-10 DIAGNOSIS — L219 Seborrheic dermatitis, unspecified: Secondary | ICD-10-CM | POA: Diagnosis not present

## 2019-09-20 DIAGNOSIS — I10 Essential (primary) hypertension: Secondary | ICD-10-CM | POA: Diagnosis not present

## 2019-09-20 DIAGNOSIS — Z0000 Encounter for general adult medical examination without abnormal findings: Secondary | ICD-10-CM | POA: Diagnosis not present

## 2019-09-20 DIAGNOSIS — E785 Hyperlipidemia, unspecified: Secondary | ICD-10-CM | POA: Diagnosis not present

## 2019-09-20 DIAGNOSIS — Z9181 History of falling: Secondary | ICD-10-CM | POA: Diagnosis not present

## 2019-09-20 DIAGNOSIS — K219 Gastro-esophageal reflux disease without esophagitis: Secondary | ICD-10-CM | POA: Diagnosis not present

## 2019-09-20 DIAGNOSIS — M8589 Other specified disorders of bone density and structure, multiple sites: Secondary | ICD-10-CM | POA: Diagnosis not present

## 2019-11-23 DIAGNOSIS — Z1231 Encounter for screening mammogram for malignant neoplasm of breast: Secondary | ICD-10-CM | POA: Diagnosis not present

## 2019-12-22 DIAGNOSIS — K219 Gastro-esophageal reflux disease without esophagitis: Secondary | ICD-10-CM | POA: Diagnosis not present

## 2019-12-22 DIAGNOSIS — E785 Hyperlipidemia, unspecified: Secondary | ICD-10-CM | POA: Diagnosis not present

## 2019-12-22 DIAGNOSIS — M8589 Other specified disorders of bone density and structure, multiple sites: Secondary | ICD-10-CM | POA: Diagnosis not present

## 2019-12-22 DIAGNOSIS — I10 Essential (primary) hypertension: Secondary | ICD-10-CM | POA: Diagnosis not present

## 2020-03-21 DIAGNOSIS — M8589 Other specified disorders of bone density and structure, multiple sites: Secondary | ICD-10-CM | POA: Diagnosis not present

## 2020-03-21 DIAGNOSIS — E785 Hyperlipidemia, unspecified: Secondary | ICD-10-CM | POA: Diagnosis not present

## 2020-03-21 DIAGNOSIS — K219 Gastro-esophageal reflux disease without esophagitis: Secondary | ICD-10-CM | POA: Diagnosis not present

## 2020-03-21 DIAGNOSIS — I10 Essential (primary) hypertension: Secondary | ICD-10-CM | POA: Diagnosis not present

## 2020-03-22 DIAGNOSIS — E875 Hyperkalemia: Secondary | ICD-10-CM | POA: Diagnosis not present

## 2020-06-20 DIAGNOSIS — I10 Essential (primary) hypertension: Secondary | ICD-10-CM | POA: Diagnosis not present

## 2020-06-20 DIAGNOSIS — K219 Gastro-esophageal reflux disease without esophagitis: Secondary | ICD-10-CM | POA: Diagnosis not present

## 2020-06-20 DIAGNOSIS — M8589 Other specified disorders of bone density and structure, multiple sites: Secondary | ICD-10-CM | POA: Diagnosis not present

## 2020-06-20 DIAGNOSIS — E785 Hyperlipidemia, unspecified: Secondary | ICD-10-CM | POA: Diagnosis not present

## 2020-07-19 DIAGNOSIS — J069 Acute upper respiratory infection, unspecified: Secondary | ICD-10-CM | POA: Diagnosis not present

## 2020-08-16 DIAGNOSIS — Z23 Encounter for immunization: Secondary | ICD-10-CM | POA: Diagnosis not present

## 2020-09-20 DIAGNOSIS — I10 Essential (primary) hypertension: Secondary | ICD-10-CM | POA: Diagnosis not present

## 2020-09-20 DIAGNOSIS — K219 Gastro-esophageal reflux disease without esophagitis: Secondary | ICD-10-CM | POA: Diagnosis not present

## 2020-09-20 DIAGNOSIS — R600 Localized edema: Secondary | ICD-10-CM | POA: Diagnosis not present

## 2020-09-20 DIAGNOSIS — E785 Hyperlipidemia, unspecified: Secondary | ICD-10-CM | POA: Diagnosis not present

## 2020-09-24 DIAGNOSIS — Z139 Encounter for screening, unspecified: Secondary | ICD-10-CM | POA: Diagnosis not present

## 2020-09-24 DIAGNOSIS — Z9181 History of falling: Secondary | ICD-10-CM | POA: Diagnosis not present

## 2020-09-24 DIAGNOSIS — Z0000 Encounter for general adult medical examination without abnormal findings: Secondary | ICD-10-CM | POA: Diagnosis not present

## 2020-09-24 DIAGNOSIS — E785 Hyperlipidemia, unspecified: Secondary | ICD-10-CM | POA: Diagnosis not present

## 2020-09-27 DIAGNOSIS — R053 Chronic cough: Secondary | ICD-10-CM | POA: Diagnosis not present

## 2020-09-27 DIAGNOSIS — R0982 Postnasal drip: Secondary | ICD-10-CM | POA: Diagnosis not present

## 2020-09-30 DIAGNOSIS — J40 Bronchitis, not specified as acute or chronic: Secondary | ICD-10-CM | POA: Diagnosis not present

## 2020-09-30 DIAGNOSIS — R053 Chronic cough: Secondary | ICD-10-CM | POA: Diagnosis not present

## 2020-12-20 DIAGNOSIS — I10 Essential (primary) hypertension: Secondary | ICD-10-CM | POA: Diagnosis not present

## 2020-12-20 DIAGNOSIS — E785 Hyperlipidemia, unspecified: Secondary | ICD-10-CM | POA: Diagnosis not present

## 2020-12-20 DIAGNOSIS — L219 Seborrheic dermatitis, unspecified: Secondary | ICD-10-CM | POA: Diagnosis not present

## 2020-12-20 DIAGNOSIS — R600 Localized edema: Secondary | ICD-10-CM | POA: Diagnosis not present

## 2020-12-20 DIAGNOSIS — K219 Gastro-esophageal reflux disease without esophagitis: Secondary | ICD-10-CM | POA: Diagnosis not present

## 2021-01-06 DIAGNOSIS — M8589 Other specified disorders of bone density and structure, multiple sites: Secondary | ICD-10-CM | POA: Diagnosis not present

## 2021-01-06 DIAGNOSIS — Z1231 Encounter for screening mammogram for malignant neoplasm of breast: Secondary | ICD-10-CM | POA: Diagnosis not present

## 2021-03-21 DIAGNOSIS — I10 Essential (primary) hypertension: Secondary | ICD-10-CM | POA: Diagnosis not present

## 2021-03-21 DIAGNOSIS — U099 Post covid-19 condition, unspecified: Secondary | ICD-10-CM | POA: Diagnosis not present

## 2021-03-21 DIAGNOSIS — E785 Hyperlipidemia, unspecified: Secondary | ICD-10-CM | POA: Diagnosis not present

## 2021-03-21 DIAGNOSIS — R600 Localized edema: Secondary | ICD-10-CM | POA: Diagnosis not present

## 2021-03-21 DIAGNOSIS — R053 Chronic cough: Secondary | ICD-10-CM | POA: Diagnosis not present

## 2021-05-26 DIAGNOSIS — R21 Rash and other nonspecific skin eruption: Secondary | ICD-10-CM | POA: Diagnosis not present

## 2021-05-26 DIAGNOSIS — I10 Essential (primary) hypertension: Secondary | ICD-10-CM | POA: Diagnosis not present

## 2021-05-26 DIAGNOSIS — Z6822 Body mass index (BMI) 22.0-22.9, adult: Secondary | ICD-10-CM | POA: Diagnosis not present

## 2021-06-30 DIAGNOSIS — I10 Essential (primary) hypertension: Secondary | ICD-10-CM | POA: Diagnosis not present

## 2021-06-30 DIAGNOSIS — E785 Hyperlipidemia, unspecified: Secondary | ICD-10-CM | POA: Diagnosis not present

## 2021-06-30 DIAGNOSIS — R21 Rash and other nonspecific skin eruption: Secondary | ICD-10-CM | POA: Diagnosis not present

## 2021-06-30 DIAGNOSIS — Z79899 Other long term (current) drug therapy: Secondary | ICD-10-CM | POA: Diagnosis not present

## 2021-06-30 DIAGNOSIS — R600 Localized edema: Secondary | ICD-10-CM | POA: Diagnosis not present

## 2021-08-25 DIAGNOSIS — Z23 Encounter for immunization: Secondary | ICD-10-CM | POA: Diagnosis not present

## 2021-09-27 DIAGNOSIS — E785 Hyperlipidemia, unspecified: Secondary | ICD-10-CM | POA: Diagnosis not present

## 2021-09-27 DIAGNOSIS — R600 Localized edema: Secondary | ICD-10-CM | POA: Diagnosis not present

## 2021-09-27 DIAGNOSIS — R2689 Other abnormalities of gait and mobility: Secondary | ICD-10-CM | POA: Diagnosis not present

## 2021-09-27 DIAGNOSIS — R251 Tremor, unspecified: Secondary | ICD-10-CM | POA: Diagnosis not present

## 2021-09-27 DIAGNOSIS — I10 Essential (primary) hypertension: Secondary | ICD-10-CM | POA: Diagnosis not present

## 2021-09-30 DIAGNOSIS — Z139 Encounter for screening, unspecified: Secondary | ICD-10-CM | POA: Diagnosis not present

## 2021-09-30 DIAGNOSIS — Z0000 Encounter for general adult medical examination without abnormal findings: Secondary | ICD-10-CM | POA: Diagnosis not present

## 2021-09-30 DIAGNOSIS — E785 Hyperlipidemia, unspecified: Secondary | ICD-10-CM | POA: Diagnosis not present

## 2021-09-30 DIAGNOSIS — Z9181 History of falling: Secondary | ICD-10-CM | POA: Diagnosis not present

## 2021-10-02 DIAGNOSIS — R2689 Other abnormalities of gait and mobility: Secondary | ICD-10-CM | POA: Diagnosis not present

## 2021-10-02 DIAGNOSIS — M6281 Muscle weakness (generalized): Secondary | ICD-10-CM | POA: Diagnosis not present

## 2021-10-09 DIAGNOSIS — M6281 Muscle weakness (generalized): Secondary | ICD-10-CM | POA: Diagnosis not present

## 2021-10-09 DIAGNOSIS — R2689 Other abnormalities of gait and mobility: Secondary | ICD-10-CM | POA: Diagnosis not present

## 2021-10-16 DIAGNOSIS — R2689 Other abnormalities of gait and mobility: Secondary | ICD-10-CM | POA: Diagnosis not present

## 2021-10-16 DIAGNOSIS — M6281 Muscle weakness (generalized): Secondary | ICD-10-CM | POA: Diagnosis not present

## 2021-10-23 DIAGNOSIS — R2689 Other abnormalities of gait and mobility: Secondary | ICD-10-CM | POA: Diagnosis not present

## 2021-10-23 DIAGNOSIS — M6281 Muscle weakness (generalized): Secondary | ICD-10-CM | POA: Diagnosis not present

## 2021-10-28 DIAGNOSIS — M6281 Muscle weakness (generalized): Secondary | ICD-10-CM | POA: Diagnosis not present

## 2021-10-28 DIAGNOSIS — R2689 Other abnormalities of gait and mobility: Secondary | ICD-10-CM | POA: Diagnosis not present

## 2021-11-06 DIAGNOSIS — M6281 Muscle weakness (generalized): Secondary | ICD-10-CM | POA: Diagnosis not present

## 2021-11-06 DIAGNOSIS — R2689 Other abnormalities of gait and mobility: Secondary | ICD-10-CM | POA: Diagnosis not present

## 2021-11-12 DIAGNOSIS — R2689 Other abnormalities of gait and mobility: Secondary | ICD-10-CM | POA: Diagnosis not present

## 2021-11-12 DIAGNOSIS — M6281 Muscle weakness (generalized): Secondary | ICD-10-CM | POA: Diagnosis not present

## 2021-11-19 DIAGNOSIS — M6281 Muscle weakness (generalized): Secondary | ICD-10-CM | POA: Diagnosis not present

## 2021-11-19 DIAGNOSIS — R2689 Other abnormalities of gait and mobility: Secondary | ICD-10-CM | POA: Diagnosis not present

## 2021-11-25 DIAGNOSIS — M6281 Muscle weakness (generalized): Secondary | ICD-10-CM | POA: Diagnosis not present

## 2021-11-25 DIAGNOSIS — R2689 Other abnormalities of gait and mobility: Secondary | ICD-10-CM | POA: Diagnosis not present

## 2021-11-26 DIAGNOSIS — M7989 Other specified soft tissue disorders: Secondary | ICD-10-CM | POA: Diagnosis not present

## 2021-11-26 DIAGNOSIS — M65341 Trigger finger, right ring finger: Secondary | ICD-10-CM | POA: Diagnosis not present

## 2021-11-26 DIAGNOSIS — M25552 Pain in left hip: Secondary | ICD-10-CM | POA: Diagnosis not present

## 2021-12-04 DIAGNOSIS — R2689 Other abnormalities of gait and mobility: Secondary | ICD-10-CM | POA: Diagnosis not present

## 2021-12-04 DIAGNOSIS — M6281 Muscle weakness (generalized): Secondary | ICD-10-CM | POA: Diagnosis not present

## 2021-12-11 DIAGNOSIS — R2689 Other abnormalities of gait and mobility: Secondary | ICD-10-CM | POA: Diagnosis not present

## 2021-12-11 DIAGNOSIS — M6281 Muscle weakness (generalized): Secondary | ICD-10-CM | POA: Diagnosis not present

## 2021-12-16 DIAGNOSIS — M48061 Spinal stenosis, lumbar region without neurogenic claudication: Secondary | ICD-10-CM | POA: Diagnosis not present

## 2021-12-18 DIAGNOSIS — M6281 Muscle weakness (generalized): Secondary | ICD-10-CM | POA: Diagnosis not present

## 2021-12-18 DIAGNOSIS — R2689 Other abnormalities of gait and mobility: Secondary | ICD-10-CM | POA: Diagnosis not present

## 2021-12-22 DIAGNOSIS — M6281 Muscle weakness (generalized): Secondary | ICD-10-CM | POA: Diagnosis not present

## 2021-12-22 DIAGNOSIS — R2689 Other abnormalities of gait and mobility: Secondary | ICD-10-CM | POA: Diagnosis not present

## 2021-12-25 DIAGNOSIS — M65342 Trigger finger, left ring finger: Secondary | ICD-10-CM | POA: Diagnosis not present

## 2021-12-25 DIAGNOSIS — M5451 Vertebrogenic low back pain: Secondary | ICD-10-CM | POA: Diagnosis not present

## 2021-12-29 DIAGNOSIS — I10 Essential (primary) hypertension: Secondary | ICD-10-CM | POA: Diagnosis not present

## 2021-12-29 DIAGNOSIS — R251 Tremor, unspecified: Secondary | ICD-10-CM | POA: Diagnosis not present

## 2021-12-29 DIAGNOSIS — Z1231 Encounter for screening mammogram for malignant neoplasm of breast: Secondary | ICD-10-CM | POA: Diagnosis not present

## 2021-12-29 DIAGNOSIS — Z789 Other specified health status: Secondary | ICD-10-CM | POA: Diagnosis not present

## 2021-12-29 DIAGNOSIS — R053 Chronic cough: Secondary | ICD-10-CM | POA: Diagnosis not present

## 2021-12-29 DIAGNOSIS — R600 Localized edema: Secondary | ICD-10-CM | POA: Diagnosis not present

## 2021-12-29 DIAGNOSIS — E785 Hyperlipidemia, unspecified: Secondary | ICD-10-CM | POA: Diagnosis not present

## 2021-12-29 DIAGNOSIS — R2689 Other abnormalities of gait and mobility: Secondary | ICD-10-CM | POA: Diagnosis not present

## 2021-12-29 DIAGNOSIS — U099 Post covid-19 condition, unspecified: Secondary | ICD-10-CM | POA: Diagnosis not present

## 2021-12-29 DIAGNOSIS — B354 Tinea corporis: Secondary | ICD-10-CM | POA: Diagnosis not present

## 2022-01-12 DIAGNOSIS — Z1231 Encounter for screening mammogram for malignant neoplasm of breast: Secondary | ICD-10-CM | POA: Diagnosis not present

## 2022-01-12 DIAGNOSIS — Z1212 Encounter for screening for malignant neoplasm of rectum: Secondary | ICD-10-CM | POA: Diagnosis not present

## 2022-01-12 DIAGNOSIS — Z1211 Encounter for screening for malignant neoplasm of colon: Secondary | ICD-10-CM | POA: Diagnosis not present

## 2022-02-16 DIAGNOSIS — R195 Other fecal abnormalities: Secondary | ICD-10-CM | POA: Diagnosis not present

## 2022-03-12 DIAGNOSIS — K5909 Other constipation: Secondary | ICD-10-CM | POA: Diagnosis not present

## 2022-03-12 DIAGNOSIS — R195 Other fecal abnormalities: Secondary | ICD-10-CM | POA: Diagnosis not present

## 2022-03-12 DIAGNOSIS — Z7982 Long term (current) use of aspirin: Secondary | ICD-10-CM | POA: Diagnosis not present

## 2022-04-01 DIAGNOSIS — I10 Essential (primary) hypertension: Secondary | ICD-10-CM | POA: Diagnosis not present

## 2022-04-01 DIAGNOSIS — R2689 Other abnormalities of gait and mobility: Secondary | ICD-10-CM | POA: Diagnosis not present

## 2022-04-01 DIAGNOSIS — R600 Localized edema: Secondary | ICD-10-CM | POA: Diagnosis not present

## 2022-04-01 DIAGNOSIS — E785 Hyperlipidemia, unspecified: Secondary | ICD-10-CM | POA: Diagnosis not present

## 2022-04-01 DIAGNOSIS — R251 Tremor, unspecified: Secondary | ICD-10-CM | POA: Diagnosis not present

## 2022-05-09 DIAGNOSIS — J069 Acute upper respiratory infection, unspecified: Secondary | ICD-10-CM | POA: Diagnosis not present

## 2022-05-14 DIAGNOSIS — R0982 Postnasal drip: Secondary | ICD-10-CM | POA: Diagnosis not present

## 2022-05-14 DIAGNOSIS — J069 Acute upper respiratory infection, unspecified: Secondary | ICD-10-CM | POA: Diagnosis not present

## 2022-05-28 DIAGNOSIS — S80262A Insect bite (nonvenomous), left knee, initial encounter: Secondary | ICD-10-CM | POA: Diagnosis not present

## 2022-05-28 DIAGNOSIS — W57XXXA Bitten or stung by nonvenomous insect and other nonvenomous arthropods, initial encounter: Secondary | ICD-10-CM | POA: Diagnosis not present

## 2022-05-28 DIAGNOSIS — R21 Rash and other nonspecific skin eruption: Secondary | ICD-10-CM | POA: Diagnosis not present

## 2022-07-01 DIAGNOSIS — I10 Essential (primary) hypertension: Secondary | ICD-10-CM | POA: Diagnosis not present

## 2022-07-01 DIAGNOSIS — R251 Tremor, unspecified: Secondary | ICD-10-CM | POA: Diagnosis not present

## 2022-07-01 DIAGNOSIS — R600 Localized edema: Secondary | ICD-10-CM | POA: Diagnosis not present

## 2022-07-01 DIAGNOSIS — R2689 Other abnormalities of gait and mobility: Secondary | ICD-10-CM | POA: Diagnosis not present

## 2022-07-01 DIAGNOSIS — E785 Hyperlipidemia, unspecified: Secondary | ICD-10-CM | POA: Diagnosis not present

## 2022-07-23 DIAGNOSIS — M5451 Vertebrogenic low back pain: Secondary | ICD-10-CM | POA: Diagnosis not present

## 2022-10-06 DIAGNOSIS — Z23 Encounter for immunization: Secondary | ICD-10-CM | POA: Diagnosis not present

## 2022-10-06 DIAGNOSIS — K219 Gastro-esophageal reflux disease without esophagitis: Secondary | ICD-10-CM | POA: Diagnosis not present

## 2022-10-06 DIAGNOSIS — Z6822 Body mass index (BMI) 22.0-22.9, adult: Secondary | ICD-10-CM | POA: Diagnosis not present

## 2022-10-06 DIAGNOSIS — I10 Essential (primary) hypertension: Secondary | ICD-10-CM | POA: Diagnosis not present

## 2022-10-06 DIAGNOSIS — E785 Hyperlipidemia, unspecified: Secondary | ICD-10-CM | POA: Diagnosis not present

## 2022-11-20 DIAGNOSIS — M65341 Trigger finger, right ring finger: Secondary | ICD-10-CM | POA: Diagnosis not present

## 2022-11-20 DIAGNOSIS — M65331 Trigger finger, right middle finger: Secondary | ICD-10-CM | POA: Diagnosis not present

## 2022-11-20 DIAGNOSIS — M79642 Pain in left hand: Secondary | ICD-10-CM | POA: Diagnosis not present

## 2023-01-01 DIAGNOSIS — M65331 Trigger finger, right middle finger: Secondary | ICD-10-CM | POA: Diagnosis not present

## 2023-01-01 DIAGNOSIS — M65341 Trigger finger, right ring finger: Secondary | ICD-10-CM | POA: Diagnosis not present

## 2023-01-05 DIAGNOSIS — R2689 Other abnormalities of gait and mobility: Secondary | ICD-10-CM | POA: Diagnosis not present

## 2023-01-05 DIAGNOSIS — I10 Essential (primary) hypertension: Secondary | ICD-10-CM | POA: Diagnosis not present

## 2023-01-05 DIAGNOSIS — K219 Gastro-esophageal reflux disease without esophagitis: Secondary | ICD-10-CM | POA: Diagnosis not present

## 2023-01-05 DIAGNOSIS — E785 Hyperlipidemia, unspecified: Secondary | ICD-10-CM | POA: Diagnosis not present

## 2023-01-05 DIAGNOSIS — R251 Tremor, unspecified: Secondary | ICD-10-CM | POA: Diagnosis not present

## 2023-01-05 DIAGNOSIS — R600 Localized edema: Secondary | ICD-10-CM | POA: Diagnosis not present

## 2023-01-14 DIAGNOSIS — Z1231 Encounter for screening mammogram for malignant neoplasm of breast: Secondary | ICD-10-CM | POA: Diagnosis not present

## 2023-01-15 DIAGNOSIS — M65331 Trigger finger, right middle finger: Secondary | ICD-10-CM | POA: Diagnosis not present

## 2023-01-15 DIAGNOSIS — M65341 Trigger finger, right ring finger: Secondary | ICD-10-CM | POA: Diagnosis not present

## 2023-01-19 DIAGNOSIS — R251 Tremor, unspecified: Secondary | ICD-10-CM | POA: Diagnosis not present

## 2023-01-19 DIAGNOSIS — I10 Essential (primary) hypertension: Secondary | ICD-10-CM | POA: Diagnosis not present

## 2023-01-19 DIAGNOSIS — J019 Acute sinusitis, unspecified: Secondary | ICD-10-CM | POA: Diagnosis not present

## 2023-02-01 DIAGNOSIS — I10 Essential (primary) hypertension: Secondary | ICD-10-CM | POA: Diagnosis not present

## 2023-02-08 DIAGNOSIS — R059 Cough, unspecified: Secondary | ICD-10-CM | POA: Diagnosis not present

## 2023-02-08 DIAGNOSIS — I10 Essential (primary) hypertension: Secondary | ICD-10-CM | POA: Diagnosis not present

## 2023-02-08 DIAGNOSIS — R053 Chronic cough: Secondary | ICD-10-CM | POA: Diagnosis not present

## 2023-02-22 DIAGNOSIS — I10 Essential (primary) hypertension: Secondary | ICD-10-CM | POA: Diagnosis not present

## 2023-02-22 DIAGNOSIS — J069 Acute upper respiratory infection, unspecified: Secondary | ICD-10-CM | POA: Diagnosis not present

## 2023-04-07 DIAGNOSIS — M545 Low back pain, unspecified: Secondary | ICD-10-CM | POA: Diagnosis not present

## 2023-04-19 ENCOUNTER — Other Ambulatory Visit: Payer: Self-pay | Admitting: Orthopedic Surgery

## 2023-04-19 DIAGNOSIS — G8929 Other chronic pain: Secondary | ICD-10-CM

## 2023-04-19 DIAGNOSIS — M545 Low back pain, unspecified: Secondary | ICD-10-CM | POA: Diagnosis not present

## 2023-04-19 DIAGNOSIS — M791 Myalgia, unspecified site: Secondary | ICD-10-CM | POA: Diagnosis not present

## 2023-04-19 NOTE — Discharge Instructions (Signed)

## 2023-04-20 ENCOUNTER — Ambulatory Visit
Admission: RE | Admit: 2023-04-20 | Discharge: 2023-04-20 | Disposition: A | Discharge status: Home / Self Care | Payer: Medicare Other | Source: Ambulatory Visit | Attending: Orthopedic Surgery | Admitting: Orthopedic Surgery

## 2023-04-20 DIAGNOSIS — G8929 Other chronic pain: Secondary | ICD-10-CM

## 2023-04-20 DIAGNOSIS — M545 Low back pain, unspecified: Secondary | ICD-10-CM | POA: Diagnosis not present

## 2023-04-20 DIAGNOSIS — M79606 Pain in leg, unspecified: Secondary | ICD-10-CM | POA: Diagnosis not present

## 2023-04-20 MED ORDER — MEPERIDINE HCL 50 MG/ML IJ SOLN: 50.0000 mg | Freq: Once | INTRAMUSCULAR | Status: DC | PRN

## 2023-04-20 MED ORDER — ONDANSETRON HCL 4 MG/2ML IJ SOLN: 4.0000 mg | Freq: Once | INTRAMUSCULAR | Status: DC | PRN

## 2023-04-20 MED ORDER — IOPAMIDOL (ISOVUE-M 200) INJECTION 41%: 10.0000 mL | Freq: Once | INTRAMUSCULAR | Status: DC

## 2023-04-20 MED ORDER — DIAZEPAM 5 MG PO TABS
5.0000 mg | ORAL_TABLET | Freq: Once | ORAL | Status: AC
  Administered 2023-04-20: 5 mg via ORAL

## 2023-04-20 MED ORDER — IOPAMIDOL (ISOVUE-M 200) INJECTION 41%
20.0000 mL | Freq: Once | INTRAMUSCULAR | Status: AC
  Administered 2023-04-20: 20 mL via INTRATHECAL

## 2023-04-21 DIAGNOSIS — G250 Essential tremor: Secondary | ICD-10-CM | POA: Diagnosis not present

## 2023-04-21 DIAGNOSIS — Z6821 Body mass index (BMI) 21.0-21.9, adult: Secondary | ICD-10-CM | POA: Diagnosis not present

## 2023-04-21 DIAGNOSIS — I10 Essential (primary) hypertension: Secondary | ICD-10-CM | POA: Diagnosis not present

## 2023-04-26 DIAGNOSIS — M545 Low back pain, unspecified: Secondary | ICD-10-CM | POA: Diagnosis not present

## 2023-05-05 ENCOUNTER — Encounter: Payer: Self-pay | Admitting: Internal Medicine

## 2023-05-05 ENCOUNTER — Ambulatory Visit: Payer: Medicare Other | Attending: Internal Medicine | Admitting: Internal Medicine

## 2023-05-05 VITALS — BP 118/68 | HR 68 | Ht 67.0 in | Wt 136.0 lb

## 2023-05-05 DIAGNOSIS — I10 Essential (primary) hypertension: Secondary | ICD-10-CM | POA: Diagnosis not present

## 2023-05-05 DIAGNOSIS — G629 Polyneuropathy, unspecified: Secondary | ICD-10-CM | POA: Diagnosis not present

## 2023-05-05 NOTE — Patient Instructions (Addendum)
Medication Instructions:  Take your Amlodipine at night. Continue to take your other medications in the morning. *If you need a refill on your cardiac medications before your next appointment, please call your pharmacy*   Follow-Up: At [facility], you and your health needs are our priority.  As part of our continuing mission to provide you with exceptional heart care, we have created designated Provider Care Teams.  These Care Teams include your primary Cardiologist (physician) and Advanced Practice Providers (APPs -  Physician Assistants and Nurse Practitioners) who all work together to provide you with the care you need, when you need it.  We recommend signing up for the patient portal called "MyChart".  Sign up information is provided on this After Visit Summary.  MyChart is used to connect with patients for Virtual Visits (Telemedicine).  Patients are able to view lab/test results, encounter notes, upcoming appointments, etc.  Non-urgent messages can be sent to your provider as well.   To learn more about what you can do with MyChart, go to [email or web address].    Your next appointment:   February 2025  Provider:   Dietrich Pates, MD  Referral to Dr. Allena Katz at [facility]

## 2023-05-05 NOTE — Progress Notes (Unsigned)
Cardiology Office Note   Date:  05/05/2023   ID:  [name], DOB Oct 14, 1946, MRN [id number]  PCP:  Buckner Malta, MD  Cardiologist:   Dietrich Pates, MD   Patient presentss for continued cardiac care    Previously Kristy Hill  (last seen in clinic in 2012)   History of Present Illness: Kristy Hill is a 77 y.o. female  who I have seen in past, last in 2012  (Before remarrying was Kristy Hill. Currently a widow).    The pt  has a  history of HTN, HL,intermitt CP  intracerebral aneurysms (s/p coiling [facility]), peripheral neuropathy  She was last seen in cardiology in 2012  Myoview in 2012 was normal  About 3 months ago she had bump in blood pressure   160s.   Seen by PCP   Placed on Amlodipine 5 mg  in April   He went to double diovan  She did not    Patient says BP 140s  The pt denies CP  Breathing is OK  NO palpitatons     Activity limited by neuropathy and also footdrop of R foot   Current Meds  Medication Sig   ALPRAZolam (NIRAVAM) 0.25 MG dissolvable tablet Take 0.25 mg by mouth at bedtime as needed for anxiety.   amLODipine (NORVASC) 5 MG tablet Take 5 mg by mouth daily.   aspirin 81 MG chewable tablet Chew 81 mg by mouth daily.   azelastine (ASTELIN) 0.1 % nasal spray Place 1 spray into both nostrils 2 (two) times daily. Use in each nostril as directed   Calcium Carb-Cholecalciferol (CALCIUM 600+D) 600-20 MG-MCG TABS Take by mouth.   calcium-vitamin D (OSCAL WITH D) 500-200 MG-UNIT tablet Take 1 tablet by mouth daily with breakfast.   Fluticasone Furoate 50 MCG/ACT AEPB Inhale into the lungs.   levofloxacin (LEVAQUIN) 750 MG tablet Take 750 mg by mouth daily.   naproxen sodium (ANAPROX) 220 MG tablet Take 220 mg by mouth 2 (two) times daily with a meal.   nystatin cream (MYCOSTATIN) Apply 1 Application topically 2 (two) times daily.   Omega-3 Fatty Acids (FISH OIL BURP-LESS) 1200 MG CAPS Take 1,200 mg by mouth daily.   promethazine-dextromethorphan  (PROMETHAZINE-DM) 6.25-15 MG/5ML syrup Take 5 mLs by mouth 6 (six) times daily.   propranolol (INDERAL) 10 MG tablet Take 0.5 tablets by mouth at bedtime.   valsartan-hydrochlorothiazide (DIOVAN-HCT) 160-25 MG tablet Take 1 tablet by mouth daily.   [DISCONTINUED] METOPROLOL SUCCINATE PO Take 50 mg by mouth daily.   [DISCONTINUED] valsartan (DIOVAN) 160 MG tablet Take 160 mg by mouth daily.     Allergies:   Actonel [risedronate sodium], Darvon [propoxyphene], and Statins   Past Medical History:  Diagnosis Date   Allergic rhinitis due to pollen    Anxiety    Balance disorder    Bladder spasms    BMI 22.0-22.9, adult    Cerebral aneurysm    Cough    Drug therapy    Dyspnea    Eczema    GERD (gastroesophageal reflux disease)    HTN (hypertension)    HTN (hypertension)    Hyperlipidemia    Insomnia    Low back pain    Lumbar spinal stenosis    Menopausal state    Muscular aches    Myopathy    Pedal edema    Reflux esophagitis    Tremor of both hands    URI (upper respiratory infection)    UTI (urinary tract infection)  Past Surgical History:  Procedure Laterality Date   ANEURYSUM PROCEDURE     TONSILLECTOMY     TUBAL LIGATION       Social History:  The patient  reports that she quit smoking about 17 years ago. Her smoking use included cigarettes. She has a 7.50 pack-year smoking history. She does not have any smokeless tobacco history on file.   Family History:  The patient's family history includes Diabetes in her maternal grandmother and mother; Heart attack in her mother; Heart disease in her mother; Hypertension in her mother; Stroke in her mother; Tuberculosis in her father.    ROS:  Please see the history of present illness. All other systems are reviewed and  Negative to the above problem except as noted.    PHYSICAL EXAM: VS:  BP 118/68   Pulse 68   Ht 5\' 7"  (1.702 m)   Wt 136 lb (61.7 kg)   SpO2 97%   BMI 21.30 kg/m   GEN: Well nourished, well  developed, in no acute distress  HEENT: normal  Neck: no JVD, carotid bruits, Cardiac: RRR; no murmurs,  No LE edema  Respiratory:  clear to auscultation bilaterally,  GI: soft, nontender, nondistended, No hepatomegaly  MS: no deformity Moving all extremities   Skin: warm and dry, no rash  EKG:  EKG is ordered today.  NSR   68 bpm  INcomp RBBB   Lipid Panel No results found for: "CHOL", "TRIG", "HDL", "CHOLHDL", "VLDL", "LDLCALC", "LDLDIRECT"    Wt Readings from Last 3 Encounters:  05/05/23 136 lb (61.7 kg)      ASSESSMENT AND PLAN:  1  HTN   PT with hx of CP   Recent bump in readings   Today it is good in clinic   She says it can be higher at home   I would recomm following on current regimen before changing  When she comes in again keep cuff and log to confirm reliability, review more history  2  HL   Pt with LDL 169  HDL 63   When I saw pt in the past (records under Kristy Hill) had tried meds  Did not tolerate  Has 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  Keep on diet   Will review medical  options before Rx  3  Neuro  Pt had been followed at [facility]   Had coiling of a R carotid aneurysm.  Review of recordsd from past she did not have a good experience.  Has not returned She also has a hx of foot drop and neuropathy  Wears a brace on R foot Will refer to D Patel in neuro for evaluation  Plan for follow up  in clinic this winter      Current medicines are reviewed at length with the patient today.  The patient does not have concerns regarding medicines.  Signed, Dietrich Pates, MD  05/05/2023 11:18 AM    [facility] [address] [place], [place], [place]  [address] Phone: [phone]; Fax: [phone]

## 2023-05-10 DIAGNOSIS — M5451 Vertebrogenic low back pain: Secondary | ICD-10-CM | POA: Diagnosis not present

## 2023-05-17 DIAGNOSIS — G5792 Unspecified mononeuropathy of left lower limb: Secondary | ICD-10-CM | POA: Diagnosis not present

## 2023-05-17 DIAGNOSIS — G259 Extrapyramidal and movement disorder, unspecified: Secondary | ICD-10-CM | POA: Diagnosis not present

## 2023-05-17 DIAGNOSIS — G250 Essential tremor: Secondary | ICD-10-CM | POA: Diagnosis not present

## 2023-05-17 DIAGNOSIS — S82831A Other fracture of upper and lower end of right fibula, initial encounter for closed fracture: Secondary | ICD-10-CM | POA: Diagnosis not present

## 2023-05-17 DIAGNOSIS — M21371 Foot drop, right foot: Secondary | ICD-10-CM | POA: Diagnosis not present

## 2023-05-17 DIAGNOSIS — Z6821 Body mass index (BMI) 21.0-21.9, adult: Secondary | ICD-10-CM | POA: Diagnosis not present

## 2023-05-21 DIAGNOSIS — S8264XA Nondisplaced fracture of lateral malleolus of right fibula, initial encounter for closed fracture: Secondary | ICD-10-CM | POA: Diagnosis not present

## 2023-05-21 DIAGNOSIS — M25571 Pain in right ankle and joints of right foot: Secondary | ICD-10-CM | POA: Diagnosis not present

## 2023-05-26 DIAGNOSIS — L602 Onychogryphosis: Secondary | ICD-10-CM | POA: Diagnosis not present

## 2023-05-26 DIAGNOSIS — L84 Corns and callosities: Secondary | ICD-10-CM | POA: Diagnosis not present

## 2023-06-24 DIAGNOSIS — S82831A Other fracture of upper and lower end of right fibula, initial encounter for closed fracture: Secondary | ICD-10-CM | POA: Diagnosis not present

## 2023-06-24 DIAGNOSIS — G259 Extrapyramidal and movement disorder, unspecified: Secondary | ICD-10-CM | POA: Diagnosis not present

## 2023-06-24 DIAGNOSIS — G250 Essential tremor: Secondary | ICD-10-CM | POA: Diagnosis not present

## 2023-06-24 DIAGNOSIS — Z6821 Body mass index (BMI) 21.0-21.9, adult: Secondary | ICD-10-CM | POA: Diagnosis not present

## 2023-06-24 DIAGNOSIS — I10 Essential (primary) hypertension: Secondary | ICD-10-CM | POA: Diagnosis not present

## 2023-08-10 ENCOUNTER — Telehealth: Payer: Self-pay

## 2023-08-10 DIAGNOSIS — Z23 Encounter for immunization: Secondary | ICD-10-CM | POA: Diagnosis not present

## 2023-08-10 NOTE — Telephone Encounter (Signed)
Carey Bullocks, RN FYI,  [place] neuro has closed this referral until they get notes on patients tremors.   Dr Tenny Craw' last note re: her tremors sent to Dr Allena Katz for her review.

## 2023-08-30 DIAGNOSIS — L602 Onychogryphosis: Secondary | ICD-10-CM | POA: Diagnosis not present

## 2023-08-30 DIAGNOSIS — Z789 Other specified health status: Secondary | ICD-10-CM | POA: Diagnosis not present

## 2023-08-30 DIAGNOSIS — Z7409 Other reduced mobility: Secondary | ICD-10-CM | POA: Diagnosis not present

## 2023-08-30 DIAGNOSIS — M21371 Foot drop, right foot: Secondary | ICD-10-CM | POA: Diagnosis not present

## 2023-08-30 DIAGNOSIS — L84 Corns and callosities: Secondary | ICD-10-CM | POA: Diagnosis not present

## 2023-09-08 DIAGNOSIS — J329 Chronic sinusitis, unspecified: Secondary | ICD-10-CM | POA: Diagnosis not present

## 2023-09-08 DIAGNOSIS — J40 Bronchitis, not specified as acute or chronic: Secondary | ICD-10-CM | POA: Diagnosis not present

## 2023-09-08 DIAGNOSIS — Z6821 Body mass index (BMI) 21.0-21.9, adult: Secondary | ICD-10-CM | POA: Diagnosis not present

## 2023-09-27 DIAGNOSIS — Z79899 Other long term (current) drug therapy: Secondary | ICD-10-CM | POA: Diagnosis not present

## 2023-09-27 DIAGNOSIS — Z6821 Body mass index (BMI) 21.0-21.9, adult: Secondary | ICD-10-CM | POA: Diagnosis not present

## 2023-09-27 DIAGNOSIS — I10 Essential (primary) hypertension: Secondary | ICD-10-CM | POA: Diagnosis not present

## 2023-09-27 DIAGNOSIS — Z0000 Encounter for general adult medical examination without abnormal findings: Secondary | ICD-10-CM | POA: Diagnosis not present

## 2023-09-27 DIAGNOSIS — Z136 Encounter for screening for cardiovascular disorders: Secondary | ICD-10-CM | POA: Diagnosis not present

## 2023-09-27 DIAGNOSIS — G250 Essential tremor: Secondary | ICD-10-CM | POA: Diagnosis not present

## 2023-09-27 DIAGNOSIS — R7302 Impaired glucose tolerance (oral): Secondary | ICD-10-CM | POA: Diagnosis not present

## 2023-10-08 DIAGNOSIS — M25511 Pain in right shoulder: Secondary | ICD-10-CM | POA: Diagnosis not present

## 2023-11-09 DIAGNOSIS — I10 Essential (primary) hypertension: Secondary | ICD-10-CM | POA: Diagnosis not present

## 2023-11-09 DIAGNOSIS — G250 Essential tremor: Secondary | ICD-10-CM | POA: Diagnosis not present

## 2023-11-09 DIAGNOSIS — L658 Other specified nonscarring hair loss: Secondary | ICD-10-CM | POA: Diagnosis not present

## 2023-11-09 DIAGNOSIS — G259 Extrapyramidal and movement disorder, unspecified: Secondary | ICD-10-CM | POA: Diagnosis not present

## 2023-11-09 DIAGNOSIS — Z6822 Body mass index (BMI) 22.0-22.9, adult: Secondary | ICD-10-CM | POA: Diagnosis not present

## 2023-11-11 ENCOUNTER — Encounter: Payer: Self-pay | Admitting: Neurology

## 2023-11-23 ENCOUNTER — Ambulatory Visit: Payer: Medicare Other | Admitting: Neurology

## 2023-12-01 DIAGNOSIS — L602 Onychogryphosis: Secondary | ICD-10-CM | POA: Diagnosis not present

## 2023-12-01 DIAGNOSIS — Z7409 Other reduced mobility: Secondary | ICD-10-CM | POA: Diagnosis not present

## 2023-12-01 DIAGNOSIS — M21371 Foot drop, right foot: Secondary | ICD-10-CM | POA: Diagnosis not present

## 2023-12-01 DIAGNOSIS — L84 Corns and callosities: Secondary | ICD-10-CM | POA: Diagnosis not present

## 2023-12-01 DIAGNOSIS — Z789 Other specified health status: Secondary | ICD-10-CM | POA: Diagnosis not present

## 2023-12-17 NOTE — Progress Notes (Signed)
 Assessment/Plan:   1.  Tremor  -Likely essential tremor, but tremor is a bit arrhythmic and certainly has a rest component, but that is not the primary component.  Would definitely like to see an MRI of the brain, but she reports history of clipping and stenting for cerebral aneurysm and she thinks that the components are MRI compatible, but was never able to get clearance from [facility] for the MRI.  [facility] records that I can see from years ago indicate that she had coil embolization of the right internal carotid artery terminus.  I do not see clips.  Nonetheless, she has been told that because that [facility] would not give her clearance, they can only assume that she cannot have MRI.  We deferred doing CT, as I am not sure that will be very helpful right now  -We did opt to trial primidone and work to 50 mg twice per day.  Discussed risk, benefits, side effects.  -She asked about increasing her Inderal LA, 80 mg.  It was just increased last month, but she was fairly bradycardic on the medication, and I told her I do not think that there is any further room to increase.  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  We will do CTA, as above.  It has been years since she has followed up.  3.  Lumbar radiculopathy  -has bilateral foot drop   -has seen Dr. Hardie Lora and Dr. Darrelyn Hillock for the above.  I do not have any of the notes, but she states that she was told she was not a surgical candidate.  I do see that she has had CT lumbar spine and myelography done last year.  -Patient asks about further treating what she calls her neuropathy.  She states that she was diagnosed with this by Dr. Sandria Manly many years ago and was told that that was why she had foot drop.  I do not think that that would be the case.  We may consider EMG in the future, but I really think that  the foot drop is likely radicular in nature and has been worked up by her other physicians.  Told her we can discuss this further next visit and she agreed.  Subjective:   Kristy Hill was seen today in the movement disorders clinic for neurologic consultation at the request of Buckner Malta, MD. Pt with grandson who supplements hx.  patient is a 78 year old female with a history of right foot drop (follows with Dr. Darrelyn Hillock for lumbar spinal stenosis), cerebral aneurysm status post stenting in 01/23/05 who presents for the evaluation of tremor.  She reports that she had another angiogram in 01/23/11 to be "checked" but "it was bad" and she didn't go back after that.  She hasn't been evaluated for that since, but knows she has another aneurysm.   Patient has been diagnosed with essential tremor.  She noted first tremor in 01/23/2019 around the time of death of her husband and she initially thought it was stress related.  She reported he died and tremor got gradually worse.  She has been on propranolol and her dosage was just recently increased her propranolol LA, 80 mg daily in early January, 2025.    Tremor: Yes.     How long has it been going on? 2020  At rest or with  activation?  Activation (will have some at rest if stressed only)  Located where?  Bilateral UE - she is L hand dominant; she also feels tremor on the inside  Affected by caffeine:  doesn't drink much  Affected by alcohol:  doesn't drink any  Affected by stress:  Yes.    Affected by fatigue:  Yes.    Spills soup if on spoon:  Yes.    Spills glass of liquid if full:  No. Per pt but grandson states that she will spill it; she does use a straw  Affects ADL's (tying shoes, brushing teeth, etc):  grandson may sometimes have to help tie the shoes if I "get really fed up"  Other Specific Symptoms:  Voice: no change Postural symptoms:  Yes.  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  Yes.  , relates to her PN; she generally  has a fall 1-2 times per week.  Last summer she turned her ankle and fx it  one fall, she went to pick up something off of the floor and she fell backward into grandsons bed and hurt her ribs Bradykinesia symptoms: "I can get up fine if I don't have to put weight on my shoulder.";   she can trip b/c of R foot drop and is awaiting a brace from [facility] Loss of smell:  Yes.   Loss of taste:  Yes.  , ever since she had covid (although is 80% better) N/V:  No. Lightheaded:  No.  Syncope: No. Diplopia:  has some when first wakens in the AM and sees double with clock on the ceiling but no other time in the day  Neuroimaging of the brain has not previously been performed in the recent years.  Reports that she does have a history of cerebral aneurysm that was clipped and she can no longer have an MRI brain (her stents may be compatible but no one knows so they won't do an MRI).   ALLERGIES:   Allergies  Allergen Reactions   Actonel [Risedronate Sodium] Other (See Comments)    "Makes me feel like I'm having a heart attack."   Darvon [Propoxyphene] Nausea And Vomiting   Statins Other (See Comments)    "Makes me feel like I'm having a heart attack."    CURRENT MEDICATIONS:  Current Outpatient Medications  Medication Instructions   ALPRAZolam (NIRAVAM) 0.25 mg, At bedtime PRN   amLODipine (NORVASC) 5 mg, Daily   aspirin 81 mg, Daily   azelastine (ASTELIN) 0.1 % nasal spray 1 spray, 2 times daily   Biotin 300 MCG TABS Take by mouth.   Calcium Carb-Cholecalciferol (CALCIUM 600+D) 600-20 MG-MCG TABS Take by mouth.   calcium-vitamin D (OSCAL WITH D) 500-200 MG-UNIT tablet 1 tablet, Daily with breakfast   Fish Oil Burp-Less 1,200 mg, Daily   Fluticasone Furoate 50 MCG/ACT AEPB Inhale into the lungs.   levofloxacin (LEVAQUIN) 750 mg, Daily   naproxen sodium (ALEVE) 220 mg, 2 times daily with meals   nystatin cream (MYCOSTATIN) 1 Application, 2 times daily   promethazine-dextromethorphan  (PROMETHAZINE-DM) 6.25-15 MG/5ML syrup 5 mLs, 6 times daily   propranolol (INDERAL) 80 mg, 3 times daily   valsartan-hydrochlorothiazide (DIOVAN-HCT) 160-25 MG tablet 1 tablet, Daily    Objective:   PHYSICAL EXAMINATION:    VITALS:   Vitals:   12/20/23 1247  BP: 126/70  Pulse: (!) 51  SpO2: 96%  Weight: 146 lb 6.4 oz (66.4 kg)    GEN:  The patient appears stated age and is in NAD. HEENT:  Normocephalic, atraumatic.  The mucous membranes are moist. The superficial temporal arteries are without ropiness or tenderness. CV:  bradycardic.  regular Lungs:  CTAB Neck/HEME:  There are no carotid bruits bilaterally.  Neurological examination:  Orientation: The patient is alert and oriented x3.  Cranial nerves: There is good facial symmetry.  Extraocular muscles are intact. The visual fields are full to confrontational testing. The speech is fluent and clear. Soft palate rises symmetrically and there is no tongue deviation. Hearing is intact to conversational tone. Sensation: Sensation is intact to light touch throughout (facial, trunk, extremities). Vibration is decreased at the bilateral big toe. There is no extinction with double simultaneous stimulation.  Motor: Strength is 5/5 in the bilateral upper and lower extremities.   Shoulder shrug is equal and symmetric.  There is no pronator drift. Deep tendon reflexes: Deep tendon reflexes are 2/4 at the bilateral biceps, triceps, brachioradialis, patella and .  Her right great toe was upgoing, but the patient states that this is been ever since a failed bunion surgery that her toe has been up in the air.  Movement examination: Tone: There is nl tone in the bilateral upper extremities.  The tone in the lower extremities is nl.  Abnormal movements: there is L>RUE rest tremor; there is postural tremor, mild.  There is intention tremor, mild.  Archimedes spirals are actually drawn fairly well, even though she has trouble getting the pen to the  paper.  Coordination:  There is no decremation with RAM's, with any form of RAMS, including alternating supination and pronation of the forearm, hand opening and closing, finger taps, heel taps and toe taps.  Gait and Station: The patient pushes off to arise.  She wears an AFO on the right.  She has bilateral foot drop, however, and marches as she walks to avoid dragging the toes.  She ambulates with a cane.   Total time spent on today's visit was 67 minutes, including both face-to-face time and nonface-to-face time.  Time included that spent on review of records (prior notes available to me/labs/imaging if pertinent), discussing treatment and goals, answering patient's questions and coordinating care.  Cc:  Buckner Malta, MD

## 2023-12-20 ENCOUNTER — Ambulatory Visit: Payer: Medicare Other | Admitting: Neurology

## 2023-12-20 ENCOUNTER — Encounter: Payer: Self-pay | Admitting: Neurology

## 2023-12-20 VITALS — BP 126/70 | HR 51 | Wt 146.4 lb

## 2023-12-20 DIAGNOSIS — M5416 Radiculopathy, lumbar region: Secondary | ICD-10-CM

## 2023-12-20 DIAGNOSIS — G250 Essential tremor: Secondary | ICD-10-CM

## 2023-12-20 DIAGNOSIS — I671 Cerebral aneurysm, nonruptured: Secondary | ICD-10-CM

## 2023-12-20 MED ORDER — PRIMIDONE 50 MG PO TABS: 50.0000 mg | ORAL_TABLET | Freq: Two times a day (BID) | ORAL | 1 refills | Status: AC

## 2023-12-20 NOTE — Patient Instructions (Addendum)
 Start primidone 50 mg - 1/2 tablet at bedtime for 1 week and then increase to 1 tablet at bedtime thereafter x 1 week and then 1 tablet twice thereafter  We will schedule the CTA brain to evaluate your aneurysm.

## 2024-01-21 ENCOUNTER — Other Ambulatory Visit: Payer: Medicare Other

## 2024-01-31 DIAGNOSIS — Z1231 Encounter for screening mammogram for malignant neoplasm of breast: Secondary | ICD-10-CM | POA: Diagnosis not present

## 2024-02-09 DIAGNOSIS — G259 Extrapyramidal and movement disorder, unspecified: Secondary | ICD-10-CM | POA: Diagnosis not present

## 2024-02-09 DIAGNOSIS — G250 Essential tremor: Secondary | ICD-10-CM | POA: Diagnosis not present

## 2024-02-09 DIAGNOSIS — Z6822 Body mass index (BMI) 22.0-22.9, adult: Secondary | ICD-10-CM | POA: Diagnosis not present

## 2024-02-09 DIAGNOSIS — I10 Essential (primary) hypertension: Secondary | ICD-10-CM | POA: Diagnosis not present

## 2024-02-09 DIAGNOSIS — E785 Hyperlipidemia, unspecified: Secondary | ICD-10-CM | POA: Diagnosis not present

## 2024-02-11 DIAGNOSIS — M6281 Muscle weakness (generalized): Secondary | ICD-10-CM | POA: Diagnosis not present

## 2024-02-11 DIAGNOSIS — R2689 Other abnormalities of gait and mobility: Secondary | ICD-10-CM | POA: Diagnosis not present

## 2024-02-14 ENCOUNTER — Ambulatory Visit
Admission: RE | Admit: 2024-02-14 | Discharge: 2024-02-14 | Disposition: A | Discharge status: Home / Self Care | Source: Ambulatory Visit | Attending: Neurology | Admitting: Neurology

## 2024-02-14 ENCOUNTER — Other Ambulatory Visit

## 2024-02-14 DIAGNOSIS — R2689 Other abnormalities of gait and mobility: Secondary | ICD-10-CM | POA: Diagnosis not present

## 2024-02-14 DIAGNOSIS — I671 Cerebral aneurysm, nonruptured: Secondary | ICD-10-CM | POA: Diagnosis not present

## 2024-02-14 DIAGNOSIS — M6281 Muscle weakness (generalized): Secondary | ICD-10-CM | POA: Diagnosis not present

## 2024-02-14 MED ORDER — IOPAMIDOL (ISOVUE-370) INJECTION 76%
75.0000 mL | Freq: Once | INTRAVENOUS | Status: AC | PRN
  Administered 2024-02-14: 75 mL via INTRAVENOUS

## 2024-02-17 DIAGNOSIS — M6281 Muscle weakness (generalized): Secondary | ICD-10-CM | POA: Diagnosis not present

## 2024-02-17 DIAGNOSIS — R2689 Other abnormalities of gait and mobility: Secondary | ICD-10-CM | POA: Diagnosis not present

## 2024-02-24 DIAGNOSIS — R2689 Other abnormalities of gait and mobility: Secondary | ICD-10-CM | POA: Diagnosis not present

## 2024-02-24 DIAGNOSIS — M6281 Muscle weakness (generalized): Secondary | ICD-10-CM | POA: Diagnosis not present

## 2024-02-28 DIAGNOSIS — R2689 Other abnormalities of gait and mobility: Secondary | ICD-10-CM | POA: Diagnosis not present

## 2024-02-28 DIAGNOSIS — M6281 Muscle weakness (generalized): Secondary | ICD-10-CM | POA: Diagnosis not present

## 2024-03-02 DIAGNOSIS — M6281 Muscle weakness (generalized): Secondary | ICD-10-CM | POA: Diagnosis not present

## 2024-03-02 DIAGNOSIS — R2689 Other abnormalities of gait and mobility: Secondary | ICD-10-CM | POA: Diagnosis not present

## 2024-03-06 DIAGNOSIS — M21371 Foot drop, right foot: Secondary | ICD-10-CM | POA: Diagnosis not present

## 2024-03-06 DIAGNOSIS — M2041 Other hammer toe(s) (acquired), right foot: Secondary | ICD-10-CM | POA: Diagnosis not present

## 2024-03-06 DIAGNOSIS — L84 Corns and callosities: Secondary | ICD-10-CM | POA: Diagnosis not present

## 2024-03-06 DIAGNOSIS — M2042 Other hammer toe(s) (acquired), left foot: Secondary | ICD-10-CM | POA: Diagnosis not present

## 2024-03-06 DIAGNOSIS — Z789 Other specified health status: Secondary | ICD-10-CM | POA: Diagnosis not present

## 2024-03-06 DIAGNOSIS — Z7409 Other reduced mobility: Secondary | ICD-10-CM | POA: Diagnosis not present

## 2024-03-06 DIAGNOSIS — L602 Onychogryphosis: Secondary | ICD-10-CM | POA: Diagnosis not present

## 2024-03-08 DIAGNOSIS — M6281 Muscle weakness (generalized): Secondary | ICD-10-CM | POA: Diagnosis not present

## 2024-03-08 DIAGNOSIS — R2689 Other abnormalities of gait and mobility: Secondary | ICD-10-CM | POA: Diagnosis not present

## 2024-03-10 DIAGNOSIS — M6281 Muscle weakness (generalized): Secondary | ICD-10-CM | POA: Diagnosis not present

## 2024-03-10 DIAGNOSIS — R2689 Other abnormalities of gait and mobility: Secondary | ICD-10-CM | POA: Diagnosis not present

## 2024-03-13 DIAGNOSIS — R2689 Other abnormalities of gait and mobility: Secondary | ICD-10-CM | POA: Diagnosis not present

## 2024-03-13 DIAGNOSIS — M6281 Muscle weakness (generalized): Secondary | ICD-10-CM | POA: Diagnosis not present

## 2024-03-16 DIAGNOSIS — R2689 Other abnormalities of gait and mobility: Secondary | ICD-10-CM | POA: Diagnosis not present

## 2024-03-16 DIAGNOSIS — M6281 Muscle weakness (generalized): Secondary | ICD-10-CM | POA: Diagnosis not present

## 2024-03-20 DIAGNOSIS — M6281 Muscle weakness (generalized): Secondary | ICD-10-CM | POA: Diagnosis not present

## 2024-03-20 DIAGNOSIS — R2689 Other abnormalities of gait and mobility: Secondary | ICD-10-CM | POA: Diagnosis not present

## 2024-03-22 DIAGNOSIS — G259 Extrapyramidal and movement disorder, unspecified: Secondary | ICD-10-CM | POA: Diagnosis not present

## 2024-03-22 DIAGNOSIS — Z6822 Body mass index (BMI) 22.0-22.9, adult: Secondary | ICD-10-CM | POA: Diagnosis not present

## 2024-03-22 DIAGNOSIS — G250 Essential tremor: Secondary | ICD-10-CM | POA: Diagnosis not present

## 2024-03-22 DIAGNOSIS — L239 Allergic contact dermatitis, unspecified cause: Secondary | ICD-10-CM | POA: Diagnosis not present

## 2024-03-22 DIAGNOSIS — R0982 Postnasal drip: Secondary | ICD-10-CM | POA: Diagnosis not present

## 2024-03-22 DIAGNOSIS — E785 Hyperlipidemia, unspecified: Secondary | ICD-10-CM | POA: Diagnosis not present

## 2024-03-22 DIAGNOSIS — J309 Allergic rhinitis, unspecified: Secondary | ICD-10-CM | POA: Diagnosis not present

## 2024-03-22 DIAGNOSIS — L219 Seborrheic dermatitis, unspecified: Secondary | ICD-10-CM | POA: Diagnosis not present

## 2024-03-23 DIAGNOSIS — R2689 Other abnormalities of gait and mobility: Secondary | ICD-10-CM | POA: Diagnosis not present

## 2024-03-23 DIAGNOSIS — M6281 Muscle weakness (generalized): Secondary | ICD-10-CM | POA: Diagnosis not present

## 2024-03-29 DIAGNOSIS — M6281 Muscle weakness (generalized): Secondary | ICD-10-CM | POA: Diagnosis not present

## 2024-03-29 DIAGNOSIS — R2689 Other abnormalities of gait and mobility: Secondary | ICD-10-CM | POA: Diagnosis not present

## 2024-03-31 DIAGNOSIS — M5431 Sciatica, right side: Secondary | ICD-10-CM | POA: Diagnosis not present

## 2024-04-03 DIAGNOSIS — R2689 Other abnormalities of gait and mobility: Secondary | ICD-10-CM | POA: Diagnosis not present

## 2024-04-03 DIAGNOSIS — M6281 Muscle weakness (generalized): Secondary | ICD-10-CM | POA: Diagnosis not present

## 2024-04-06 DIAGNOSIS — R2689 Other abnormalities of gait and mobility: Secondary | ICD-10-CM | POA: Diagnosis not present

## 2024-04-06 DIAGNOSIS — M6281 Muscle weakness (generalized): Secondary | ICD-10-CM | POA: Diagnosis not present

## 2024-06-07 DIAGNOSIS — M21371 Foot drop, right foot: Secondary | ICD-10-CM | POA: Diagnosis not present

## 2024-06-07 DIAGNOSIS — L602 Onychogryphosis: Secondary | ICD-10-CM | POA: Diagnosis not present

## 2024-06-07 DIAGNOSIS — Z789 Other specified health status: Secondary | ICD-10-CM | POA: Diagnosis not present

## 2024-06-07 DIAGNOSIS — L84 Corns and callosities: Secondary | ICD-10-CM | POA: Diagnosis not present

## 2024-06-07 DIAGNOSIS — Z7409 Other reduced mobility: Secondary | ICD-10-CM | POA: Diagnosis not present

## 2024-06-29 ENCOUNTER — Ambulatory Visit: Payer: Medicare Other | Admitting: Neurology

## 2024-07-17 DIAGNOSIS — Z6822 Body mass index (BMI) 22.0-22.9, adult: Secondary | ICD-10-CM | POA: Diagnosis not present

## 2024-07-17 DIAGNOSIS — G250 Essential tremor: Secondary | ICD-10-CM | POA: Diagnosis not present

## 2024-07-17 DIAGNOSIS — I10 Essential (primary) hypertension: Secondary | ICD-10-CM | POA: Diagnosis not present

## 2024-07-21 DIAGNOSIS — M546 Pain in thoracic spine: Secondary | ICD-10-CM | POA: Diagnosis not present

## 2024-07-21 DIAGNOSIS — M5451 Vertebrogenic low back pain: Secondary | ICD-10-CM | POA: Diagnosis not present

## 2024-07-24 ENCOUNTER — Other Ambulatory Visit: Payer: Self-pay | Admitting: Orthopedic Surgery

## 2024-07-24 DIAGNOSIS — M546 Pain in thoracic spine: Secondary | ICD-10-CM

## 2024-07-27 DIAGNOSIS — J208 Acute bronchitis due to other specified organisms: Secondary | ICD-10-CM | POA: Diagnosis not present

## 2024-07-27 DIAGNOSIS — R509 Fever, unspecified: Secondary | ICD-10-CM | POA: Diagnosis not present

## 2024-07-27 DIAGNOSIS — G250 Essential tremor: Secondary | ICD-10-CM | POA: Diagnosis not present

## 2024-07-27 DIAGNOSIS — Z6822 Body mass index (BMI) 22.0-22.9, adult: Secondary | ICD-10-CM | POA: Diagnosis not present

## 2024-08-11 DIAGNOSIS — M5442 Lumbago with sciatica, left side: Secondary | ICD-10-CM | POA: Diagnosis not present

## 2024-08-11 DIAGNOSIS — U099 Post covid-19 condition, unspecified: Secondary | ICD-10-CM | POA: Diagnosis not present

## 2024-08-11 DIAGNOSIS — I10 Essential (primary) hypertension: Secondary | ICD-10-CM | POA: Diagnosis not present

## 2024-08-11 DIAGNOSIS — Z23 Encounter for immunization: Secondary | ICD-10-CM | POA: Diagnosis not present

## 2024-08-11 DIAGNOSIS — G250 Essential tremor: Secondary | ICD-10-CM | POA: Diagnosis not present

## 2024-08-11 DIAGNOSIS — M5441 Lumbago with sciatica, right side: Secondary | ICD-10-CM | POA: Diagnosis not present

## 2024-08-11 DIAGNOSIS — Z6822 Body mass index (BMI) 22.0-22.9, adult: Secondary | ICD-10-CM | POA: Diagnosis not present

## 2024-08-11 DIAGNOSIS — G9332 Myalgic encephalomyelitis/chronic fatigue syndrome: Secondary | ICD-10-CM | POA: Diagnosis not present

## 2024-08-14 ENCOUNTER — Other Ambulatory Visit

## 2024-09-04 ENCOUNTER — Ambulatory Visit: Admitting: Podiatry

## 2024-09-04 ENCOUNTER — Encounter: Payer: Self-pay | Admitting: Podiatry

## 2024-09-04 DIAGNOSIS — M79675 Pain in left toe(s): Secondary | ICD-10-CM | POA: Diagnosis not present

## 2024-09-04 DIAGNOSIS — M21371 Foot drop, right foot: Secondary | ICD-10-CM | POA: Diagnosis not present

## 2024-09-04 DIAGNOSIS — M79674 Pain in right toe(s): Secondary | ICD-10-CM

## 2024-09-04 DIAGNOSIS — L909 Atrophic disorder of skin, unspecified: Secondary | ICD-10-CM | POA: Diagnosis not present

## 2024-09-04 DIAGNOSIS — B351 Tinea unguium: Secondary | ICD-10-CM | POA: Diagnosis not present

## 2024-09-04 DIAGNOSIS — L84 Corns and callosities: Secondary | ICD-10-CM

## 2024-09-04 DIAGNOSIS — G609 Hereditary and idiopathic neuropathy, unspecified: Secondary | ICD-10-CM

## 2024-09-04 NOTE — Patient Instructions (Signed)
 More silicone pads can be purchased from:  [email or web address]  Look for metarsal pads or sleeves. You can also find these on amazon or from [employer]ix.com   Drop foot brace: as an alternative can look for Drop Foot Brace AFO splint from [email or web address]

## 2024-09-04 NOTE — Progress Notes (Addendum)
 Chief Complaint  Patient presents with   RFC    RFC with callous, drop foot on the right foot. She asked about braces for foot drop, she is wearing a brace for it currently.  Not diabetic and ASA    HPI: 78 y.o. female presents today with several pedal complaints.  She comes in requesting assistance for toenail and callus care.  She has painful, thickened, elongated dystrophic toenails are tender with direct dorsal palpation.  She also has painful calluses underneath the left subfirst and subfifth metatarsal head.  She has history of idiopathic neuropathy.  She is unable to maintain her nails or take care of her feet well or self due to these issues, due to mobility and due to essential tremor.  She also is presenting with right dropfoot which alters her gait and increases left foot pressure. She would like to discuss bracing options, reports balance issues. Reports pain to the ball of the left foot and wanting to discuss inserts.  Past Medical History:  Diagnosis Date   Allergic rhinitis due to pollen    Anxiety    Balance disorder    Bladder spasms    BMI 22.0-22.9, adult    Cerebral aneurysm    Cough    Drug therapy    Dyspnea    Eczema    GERD (gastroesophageal reflux disease)    HTN (hypertension)    HTN (hypertension)    Hyperlipidemia    Insomnia    Low back pain    Lumbar spinal stenosis    Menopausal state    Muscular aches    Myopathy    Pedal edema    Reflux esophagitis    Tremor of both hands    URI (upper respiratory infection)    UTI (urinary tract infection)     Past Surgical History:  Procedure Laterality Date   ANEURYSUM PROCEDURE     TONSILLECTOMY     TUBAL LIGATION      Allergies  Allergen Reactions   Actonel [Risedronate Sodium] Other (See Comments)    Makes me feel like I'm having a heart attack.   Darvon [Propoxyphene] Nausea And Vomiting   Statins Other (See Comments)    Makes me feel like I'm having a heart attack.    ROS  denies any nausea, vomiting, fever, chills, chest pain, shortness of breath   Physical Exam: There were no vitals filed for this visit.  General: The patient is alert and oriented x3 in no acute distress.  Dermatology: Pedal skin thin and atrophic. No open wounds. Nail plates x 10 are thickened, elongated, dystrophic with yellow discoloration subungual debris.  They are tender on direct dorsal palpation.  Preulcerative hyperkeratotic lesions present left subfirst and subfifth metatarsal head.  Vascular: Palpable pedal pulses bilaterally. Capillary refill within normal limits.  No appreciable edema.  No erythema or calor.  Neurological: Light touch sensation decreased to toes.  Protective sensation decreased.  Subjective paresthesias.  Musculoskeletal Exam: Left foot muscle strength 5/5 in dorsiflexion, plantarflexion, inversion, eversion.  Right foot muscle strength 5/5 in plantarflexion, 3/5 dorsiflexion, inversion and inversion.  Passive range of right ankle dorsiflexion past neutral, 5 to 10 degrees dorsiflexion.  Active range of motion about to neutral.  Fat pad atrophy present bilateral metatarsal heads.  Generalized tenderness on palpation.   Assessment/Plan of Care: 1. Foot drop, right foot   2. Fat pad atrophy of foot   3. Callus   4. Idiopathic neuropathy  5. Pain due to onychomycosis of toenails of both feet      No orders of the defined types were placed in this encounter.  None  Discussed clinical findings with patient today.  # Preulcerative callus left subfirst and subfifth metatarsal head -All symptomatic hyperkeratoses x2 were safely debrided with a sterile #15 blade to patient's level of comfort without incident. We discussed preventative and palliative care of these lesions including supportive and accommodative shoegear, padding, prefabricated and custom molded accommodative orthoses, use of a pumice stone and lotions/creams daily. - At risk footcare due to  essential tremor, neuropathy, deformity causing altered gait - ABN on file for calluses in the event of non coverage  #Onychomycosis with pain  -Nails palliatively debrided as below. -Educated on self-care  Procedure: Nail Debridement Rationale: Pain Type of Debridement: manual, sharp debridement. Instrumentation: Nail nipper, rotary burr. 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 Prescription written for custom accommodative orthotics. - Did review various types of AFO brace options.  She does use and OTC option we did discuss various types as well -Can consider prescription bracing options if there is difficulty finding a good over the counter solution for patient. -I certify that this diagnosis represents a distinct and separate diagnosis that requires evaluation and treatment separate from other procedures or diagnosis   # Idiopathic neuropathy - Chronic longstanding diagnosis for the patient - Recommend over-the-counter nerve health supplement - Would defer to the neurologist for further prescription management given concurrent essential tremor    Thang Flett L. Lamount MAUL, AACFAS [facility]     [address] [place], [place] [address]                Office [phone]  Fax [phone]

## 2024-12-05 ENCOUNTER — Ambulatory Visit: Admitting: Podiatry
